# Patient Record
Sex: Male | Born: 1990
Health system: Southern US, Community
[De-identification: ages and names within clinical notes are randomized; demographics above are authoritative.]

## PROBLEM LIST (undated history)

## (undated) DIAGNOSIS — E559 Vitamin D deficiency, unspecified: Secondary | ICD-10-CM

## (undated) DIAGNOSIS — T7840XA Allergy, unspecified, initial encounter: Secondary | ICD-10-CM

## (undated) DIAGNOSIS — F840 Autistic disorder: Secondary | ICD-10-CM

## (undated) DIAGNOSIS — E618 Deficiency of other specified nutrient elements: Secondary | ICD-10-CM

## (undated) DIAGNOSIS — E71318 Other disorders of fatty-acid oxidation: Secondary | ICD-10-CM

## (undated) DIAGNOSIS — Z91018 Allergy to other foods: Secondary | ICD-10-CM

## (undated) DIAGNOSIS — J45909 Unspecified asthma, uncomplicated: Secondary | ICD-10-CM

## (undated) HISTORY — DX: Unspecified asthma, uncomplicated: J45.909

## (undated) HISTORY — DX: Allergy to other foods: Z91.018

## (undated) HISTORY — DX: Deficiency of other specified nutrient elements: E61.8

## (undated) HISTORY — DX: Autistic disorder: F84.0

## (undated) HISTORY — DX: Allergy, unspecified, initial encounter: T78.40XA

## (undated) HISTORY — DX: Vitamin D deficiency, unspecified: E55.9

## (undated) HISTORY — DX: Other disorders of fatty-acid oxidation: E71.318

---
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
## 2012-03-05 ENCOUNTER — Encounter: Payer: Self-pay | Admitting: *Deleted

## 2012-03-09 ENCOUNTER — Telehealth: Payer: Self-pay | Admitting: Family Medicine

## 2012-03-09 ENCOUNTER — Encounter: Payer: Self-pay | Admitting: Family Medicine

## 2012-03-09 NOTE — Telephone Encounter (Signed)
Ok to add a 2nd CPE to an afternoon that isn't already booked with med checks and CPE's.  Preferably a Wed or Thurs (not a Mon) on a day when I'm not the only provider in the office (any usual Wed or Thurs)

## 2012-06-18 ENCOUNTER — Encounter: Payer: Self-pay | Admitting: Family Medicine

## 2012-06-19 ENCOUNTER — Encounter: Payer: Self-pay | Admitting: Family Medicine

## 2012-07-09 ENCOUNTER — Ambulatory Visit (INDEPENDENT_AMBULATORY_CARE_PROVIDER_SITE_OTHER): Payer: BC Managed Care – PPO | Admitting: Family Medicine

## 2012-07-09 ENCOUNTER — Encounter: Payer: Self-pay | Admitting: Family Medicine

## 2012-07-09 VITALS — BP 100/68 | HR 84 | Ht 67.0 in | Wt 163.0 lb

## 2012-07-09 DIAGNOSIS — Z0000 Encounter for general adult medical examination without abnormal findings: Secondary | ICD-10-CM

## 2012-07-09 DIAGNOSIS — E559 Vitamin D deficiency, unspecified: Secondary | ICD-10-CM

## 2012-07-09 DIAGNOSIS — F840 Autistic disorder: Secondary | ICD-10-CM

## 2012-07-09 DIAGNOSIS — Z13220 Encounter for screening for lipoid disorders: Secondary | ICD-10-CM

## 2012-07-09 LAB — POCT URINALYSIS DIPSTICK
Bilirubin, UA: NEGATIVE
Blood, UA: NEGATIVE
Leukocytes, UA: NEGATIVE
Spec Grav, UA: 1.02
Urobilinogen, UA: NEGATIVE
pH, UA: 6

## 2012-07-09 NOTE — Progress Notes (Signed)
Chief Complaint  Patient presents with  . Annual Exam    nonfasting annual exam. Left foot needs to be looked at, something bothering him. Also had a "red line" on his penis from masturbating, would like you to check and make sure he hasn't injured himself.   Cristian Taylor is a 22 y.o. male who presents for a complete physical.  He has the following concerns:  Penile redness, as mentioned above. He is accompanied for part of the visit today by his mother.  She reports wanting only 1 vaccine at a time, thimerisol free, no preservatives, if possible.  Might go to college next year, possibly will live in dorm  Immunization History  Administered Date(s) Administered  . DTaP 10/19/1992, 12/18/1992, 03/07/1993, 11/28/1993, 06/23/1998  . Hepatitis B May 05, 1990, 09/18/1992, 06/08/1993  . HiB 10/19/1992, 12/18/1992, 03/07/1993, 11/28/1993  . IPV 10/19/1992, 12/18/1992, 03/07/1993, 06/23/1998  . MMR 11/28/1993, 06/23/1998  . Meningococcal Conjugate 07/17/2006  . Tdap 07/02/2004   Dentist: every 6 months Ophtho: within the year Exercise: rides bike, swims, pushups and sit-ups.  On swim team.  Past Medical History  Diagnosis Date  . Food allergy     and sensitivities  . Unspecified vitamin D deficiency     per Dr. Ananias Pilgrim  . Autistic disorder, current or active state   . Mineral deficiency, not elsewhere classified     per Dr. Ananias Pilgrim  . Disorders of fatty acid oxidation     per Dr. Ananias Pilgrim  . Asthma     childhood/resolved    History reviewed. No pertinent past surgical history.  History   Social History  . Marital Status: Single    Spouse Name: N/A    Number of Children: N/A  . Years of Education: N/A   Occupational History  . Not on file.   Social History Main Topics  . Smoking status: Never Smoker   . Smokeless tobacco: Not on file  . Alcohol Use: No  . Drug Use: No  . Sexually Active: Not on file   Other Topics Concern  . Not on file   Social History  Narrative   Lives with parents, brother and sister, 1 dog, 1 cat.  Being homeschooled since 8th grade.  He is taking a year off, and might take some classes at [employer] and then will apply to college.  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    Family History  Problem Relation Age of Onset  . Cancer Father     thyroid cancer  . Hypothyroidism Maternal Grandmother   . Cancer Maternal Grandmother     skin  . Heart disease Maternal Grandfather   . Diabetes Maternal Grandfather   . Asthma Paternal Grandmother   . Diabetes Paternal Grandfather     Current outpatient prescriptions:COD LIVER OIL PO, Take 4 capsules by mouth daily., Disp: , Rfl: ;  vitamin A 29562 UNIT capsule, Take 20,000 Units by mouth daily., Disp: , Rfl:  He actually takes a long list of supplements--see sheet brought in by mother.  Allergies  Allergen Reactions  . Casein   . Gluten Meal   . Mold Extract (Trichophyton)    ROS:  The patient denies anorexia, fever, weight changes, headaches,  vision loss, decreased hearing, ear pain, hoarseness, chest pain, palpitations, dizziness, syncope, dyspnea on exertion, cough, swelling, nausea, vomiting, diarrhea, constipation, abdominal pain, melena, hematochezia, indigestion/heartburn, hematuria, incontinence, erectile dysfunction, dysuria, joint pains, numbness, tingling, weakness, tremor, suspicious skin lesions, depression, anxiety, abnormal bleeding/bruising, or enlarged lymph nodes  PHYSICAL EXAM: BP 100/68  Pulse 84  Ht 5\' 7"  (1.702 m)  Wt 163 lb (73.936 kg)  BMI 25.52 kg/m2  General Appearance:    Alert, cooperative, no distress, appears stated age.  He has flat affect, poor eye contact. Flat speech with little inflection.  He answers questions, in full sentences, appropriately (just with little inflection, poor eye contact).  Head:    Normocephalic, without obvious abnormality, atraumatic  Eyes:    PERRL, conjunctiva/corneas clear, EOM's intact, fundi    benign  Ears:     Normal TM's and external ear canals  Nose:   Nares normal, mucosa normal, no drainage or sinus   tenderness  Throat:   Lips, mucosa, and tongue normal; teeth and gums normal  Neck:   Supple, no lymphadenopathy;  thyroid:  no   enlargement/tenderness/nodules; no carotid   bruit or JVD  Back:    Spine nontender, no curvature, ROM normal, no CVA     tenderness  Lungs:     Clear to auscultation bilaterally without wheezes, rales or     ronchi; respirations unlabored  Chest Wall:    No tenderness or deformity   Heart:    Regular rate and rhythm, S1 and S2 normal, no murmur, rub   or gallop  Breast Exam:    No chest wall tenderness, masses or gynecomastia  Abdomen:     Soft, non-tender, nondistended, normoactive bowel sounds,    no masses, no hepatosplenomegaly  Genitalia:    Normal male external genitalia without lesions. Some linear hyperpigmentation on posterior portion of shaft. No erythema or other abnormality.  Testicles without masses.  No inguinal hernias.  Rectal:   Deferred due to age <40 and lack of symptoms  Extremities:   No clubbing, cyanosis or edema  Pulses:   2+ and symmetric all extremities  Skin:   Skin color, texture, turgor normal, no rashes or lesions. Some mild acne on upper back and shoulder. Dry skin on upper arms, shoulder--patch of some irritation, recently excoriated  Lymph nodes:   Cervical, supraclavicular, and axillary nodes normal  Neurologic:   CNII-XII intact, normal strength, sensation and gait; reflexes 2+ and symmetric throughout          Psych:   Normal mood, hygiene and grooming.    ASSESSMENT/PLAN:  Routine general medical examination at a health care facility - Plan: Visual acuity screening, POCT Urinalysis Dipstick, Glucose, random, Lipid panel  Screening for lipoid disorders - Plan: Lipid panel  Unspecified vitamin D deficiency - Plan: Vitamin D 25 hydroxy  Autism spectrum disorder  Discussed PSA screening (risks/benefits), recommended at least  30 minutes of aerobic activity at least 5 days/week; proper sunscreen use reviewed; healthy diet, avoidance of alcohol, tobacco, drugs; abstinence/safe sex; regular seatbelt use. Self-testicular exam taught. Immunization recommendations discussed--see below.   Hep A and HPV are recommended, reviewed in detail.  Also discussed getting 2nd meningococcal vaccine at some point, prior to living in dorm. Yearly flu shots are encouraged.   Schedule nurse visits for Hep A and HPV (separately).  Also flu shot in fall.  Copies of packet inserts for vaccines were given to mother to review prior to giving.  Also to do labs at [facility]--fasting lipids, glucose and vitamin D

## 2012-07-09 NOTE — Patient Instructions (Addendum)

## 2012-07-10 ENCOUNTER — Encounter: Payer: Self-pay | Admitting: Family Medicine

## 2012-07-10 DIAGNOSIS — F840 Autistic disorder: Secondary | ICD-10-CM | POA: Insufficient documentation

## 2013-07-16 ENCOUNTER — Telehealth: Payer: Self-pay | Admitting: Family Medicine

## 2013-07-16 NOTE — Telephone Encounter (Signed)
I will look at forms on Monday.  They have only been seen once (last year for CPE) and I filled out form then (scanned).  I had attached current list of supplements to the form.  Did she drop off a new list to attach?  She never brought the boys back to have their labs done, as recommended at check-ups last year.  Schedule CPE, and bring them fasting so that we can do their bloodwork at their visits

## 2013-07-16 NOTE — Telephone Encounter (Signed)
Mother dropped off form that she says you fill out for them yearly regarding pt's supplements.  Please call when ready.  She will call back & schedule their CPE.  Form in your folder

## 2013-07-19 NOTE — Telephone Encounter (Signed)
I had sent this back to Laura--not sure if she saw/addressed.  They are not scheduled for visit.  They haven't been seen in over a year.  I don't plan on filling out the forms until they are at least scheduled for a visit (let her know that forms like this are usually filled out AT their visits, and require to be seen yearly).  As per previous message, come fasting so labs ordered last year can actually be done (never brought them back for labs)

## 2013-07-19 NOTE — Telephone Encounter (Signed)
Spoke with mom, Bonita QuinLinda and scheduled Will for 1st available CPE on 10/27/13, fasting 10:45am.

## 2013-07-23 NOTE — Telephone Encounter (Signed)
I prefer to fill forms out at visit, when she brings list of current supplements.  If she must have this done prior, then have her drop off list of supplements sooner.

## 2013-07-27 NOTE — Telephone Encounter (Signed)
Left message for mom to advise of Dr. Delford FieldKnapp's note

## 2013-10-18 ENCOUNTER — Telehealth: Payer: Self-pay | Admitting: Family Medicine

## 2013-10-18 NOTE — Telephone Encounter (Signed)
Given to Veronica to abstract, and to put with forms that will be filled out at his physical 

## 2013-10-18 NOTE — Telephone Encounter (Signed)
Mom dropped off letter with pt's supplements listed so you could review before pt's physical, letter in your folder

## 2013-10-19 ENCOUNTER — Other Ambulatory Visit: Payer: BC Managed Care – PPO

## 2013-10-19 DIAGNOSIS — Z0000 Encounter for general adult medical examination without abnormal findings: Secondary | ICD-10-CM

## 2013-10-19 DIAGNOSIS — E559 Vitamin D deficiency, unspecified: Secondary | ICD-10-CM

## 2013-10-19 DIAGNOSIS — Z13220 Encounter for screening for lipoid disorders: Secondary | ICD-10-CM

## 2013-10-20 ENCOUNTER — Encounter: Payer: Self-pay | Admitting: *Deleted

## 2013-10-20 LAB — LIPID PANEL
CHOLESTEROL: 138 mg/dL (ref 0–200)
HDL: 40 mg/dL (ref >39)
LDL CALC: 86 mg/dL (ref 0–99)
Total CHOL/HDL Ratio: 3.5 Ratio
Triglycerides: 62 mg/dL (ref <150)
VLDL: 12 mg/dL (ref 0–40)

## 2013-10-20 LAB — GLUCOSE, RANDOM: Glucose, Bld: 93 mg/dL (ref 70–99)

## 2013-10-20 LAB — VITAMIN D 25 HYDROXY (VIT D DEFICIENCY, FRACTURES): Vit D, 25-Hydroxy: 42 ng/mL (ref 30–89)

## 2013-10-27 ENCOUNTER — Ambulatory Visit (INDEPENDENT_AMBULATORY_CARE_PROVIDER_SITE_OTHER): Payer: BC Managed Care – PPO | Admitting: Family Medicine

## 2013-10-27 ENCOUNTER — Encounter: Payer: Self-pay | Admitting: Family Medicine

## 2013-10-27 VITALS — BP 118/70 | HR 76 | Ht 66.5 in | Wt 165.0 lb

## 2013-10-27 DIAGNOSIS — Z0000 Encounter for general adult medical examination without abnormal findings: Secondary | ICD-10-CM

## 2013-10-27 NOTE — Patient Instructions (Signed)
  HEALTH MAINTENANCE RECOMMENDATIONS:  It is recommended that you get at least 30 minutes of aerobic exercise at least 5 days/week (for weight loss, you may need as much as 60-90 minutes). This can be any activity that gets your heart rate up. This can be divided in 10-15 minute intervals if needed, but try and build up your endurance at least once a week.  Weight bearing exercise is also recommended twice weekly.  Eat a healthy diet with lots of vegetables, fruits and fiber.  "Colorful" foods have a lot of vitamins (ie green vegetables, tomatoes, red peppers, etc).  Limit sweet tea, regular sodas and alcoholic beverages, all of which has a lot of calories and sugar.  Up to 2 alcoholic drinks daily may be beneficial for men (unless trying to lose weight, watch sugars).  Drink a lot of water.  Sunscreen of at least SPF 30 should be used on all sun-exposed parts of the skin when outside between the hours of 10 am and 4 pm (not just when at beach or pool, but even with exercise, golf, tennis, and yard work!)  Use a sunscreen that says "broad spectrum" so it covers both UVA and UVB rays, and make sure to reapply every 1-2 hours.  Remember to change the batteries in your smoke detectors when changing your clock times in the spring and fall.  Use your seat belt every time you are in a car, and please drive safely and not be distracted with cell phones and texting while driving.  Yearly flu shots are recommended.  Meningitis vaccine (second dose) is recommended prior to college. Tetanus booster (Td is fine, doesn't have to be TdaP) is needed before June 2016.

## 2013-10-27 NOTE — Progress Notes (Signed)
Chief Complaint  Patient presents with  . nonfasting    nonfasting physical- had sausage and banana, right knee pain, stinging in chest and had a question about his testicles,unable to give  urine sample   Cristian Taylor is a 23 y.o. male who presents for a complete physical.  He has the following concerns:  Red mark on his penis he would like checked.  He thinks it might be related to when he used to masturbate. He wants to make sure that everything is okay for if/when he gets married and has sex, and wants children. He does not have a girlfriend, and has not been sexually active.  He is taking a class at [employer].  He works some odd jobs, and is out in the yard a lot. Applying to [employer] for next year, but would live at home.  Last year, he and his mother were given information on the recommended vaccines (HPV and hepatitis A), but they declined to have done.  Immunization History  Administered Date(s) Administered  . DTaP 10/19/1992, 12/18/1992, 03/07/1993, 11/28/1993, 06/23/1998  . Hepatitis B 03/27/92, 09/18/1992, 06/08/1993  . HiB (PRP-OMP) 10/19/1992, 12/18/1992, 03/07/1993, 11/28/1993  . IPV 10/19/1992, 12/18/1992, 03/07/1993, 06/23/1998  . MMR 11/28/1993, 06/23/1998  . Meningococcal Conjugate 07/17/2006  . Tdap 07/02/2004   Dentist: every 6 months  Ophtho: within 1-2 years Exercise: rides bike, swims (in summer), pushups and sit-ups and some weights.  Past Medical History  Diagnosis Date  . Food allergy     and sensitivities  . Unspecified vitamin D deficiency     per Dr. Deirdre Pippins  . Autistic disorder, current or active state   . Mineral deficiency, not elsewhere classified     per Dr. Deirdre Pippins  . Disorders of fatty acid oxidation     per Dr. Deirdre Pippins  . Asthma     childhood/resolved    History reviewed. No pertinent past surgical history.  History   Social History  . Marital Status: Single    Spouse Name: N/A    Number of Children: N/A  . Years of Education:  N/A   Occupational History  . Not on file.   Social History Main Topics  . Smoking status: Never Smoker   . Smokeless tobacco: Never Used  . Alcohol Use: No  . Drug Use: No  . Sexual Activity: No   Other Topics Concern  . Not on file   Social History Narrative   Lives with parents, brother and sister, 1 dog, 1 cat.  Being homeschooled since 8th grade. He is taking a spanish class at [employer].   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    Family History  Problem Relation Age of Onset  . Cancer Father     thyroid cancer  . Hypothyroidism Maternal Grandmother   . Cancer Maternal Grandmother     skin  . Heart disease Maternal Grandfather   . Diabetes Maternal Grandfather   . Asthma Paternal Grandmother   . Diabetes Paternal Grandfather     Outpatient Encounter Prescriptions as of 10/27/2013  Medication Sig  . Acetylcarnitine HCl 250 MG CAPS Take 1 capsule by mouth daily.  . Acetylcysteine (N-ACETYL-L-CYSTEINE PO) Take 500 mg by mouth daily.  . Betaine, Trimethylglycine, (TMG, TRIMETHYLGLYCINE, PO) Take 1-2 capsules by mouth daily. W/folinic acid & B12 (155m TMG)  . Cholecalciferol (VITAMIN D) 1000 UNITS capsule Take 1,000 Units by mouth daily.  . Coenzyme Q10 (COQ10) 50 MG CAPS Take 2 capsules by mouth daily.  . Digestive  Enzymes (ENZYME DIGEST PO) Take 3-4 capsules by mouth daily. Enzyme Complete  . FOLIC ACID PO Take by mouth. As needed for a portion of the year.  Marland Kitchen GAMMA AMINOBUTYRIC ACID PO Take 150 mg by mouth daily.  . Glucosamine Sulfate 500 MG CAPS Take 1 capsule by mouth daily.  Marland Kitchen GLUTATHIONE PO Take 250 mg by mouth daily.  Marland Kitchen GLYCINE PO Take by mouth. As needed for a portion of the year.  Marland Kitchen MAGNESIUM GLYCINATE PLUS PO Take 200 mg by mouth daily.   . Magnesium Oxide (MAG-OXIDE PO) Take 180 mg by mouth daily.  . Melatonin 1 MG CAPS Take 1 capsule by mouth as needed (for sleep).  . Multiple Vitamins-Minerals (ZINC PO) Take 20 mg by mouth daily.  . Omega-3 Fatty Acids  (FISH OIL) 1000 MG CAPS Take 4 capsules by mouth daily.  . Probiotic Product (SOLUBLE FIBER/PROBIOTICS PO) Take 1 tablet by mouth daily.  . TYROSINE PO Take by mouth. As needed for a portion of the year.  . vitamin A 10000 UNIT capsule Take 10,000 Units by mouth daily.  . vitamin C (ASCORBIC ACID) 250 MG tablet Take 250 mg by mouth daily.   . vitamin E 400 UNIT capsule Take 400 Units by mouth daily.  Marland Kitchen POTASSIUM CHLORIDE PO Take by mouth. As needed for a portion of the year.  . SELENIUM PO Take by mouth. As needed for a portion of the year.    Allergies  Allergen Reactions  . Casein   . Gluten Meal   . Mold Extract [Trichophyton]     ROS: The patient denies anorexia, fever, weight changes, headaches, vision loss, decreased hearing, ear pain, hoarseness, chest pain, palpitations, dizziness, syncope, dyspnea on exertion, cough, swelling, nausea, vomiting, diarrhea, constipation, abdominal pain, melena, hematochezia, indigestion/heartburn, hematuria, incontinence, erectile dysfunction, dysuria, joint pains, numbness, tingling, weakness, tremor, suspicious skin lesions, depression, anxiety, abnormal bleeding/bruising, or enlarged lymph nodes  Sometimes he feels some stinging in his chest, but he thinks it is the muscles. No pain or swelling in the testicles. Left knee clicks sometimes.  No pain or swelling.  He is very active.  PHYSICAL EXAM:  BP 118/70  Pulse 76  Ht 5' 6.5" (1.689 m)  Wt 165 lb (74.844 kg)  BMI 26.24 kg/m2  General Appearance:  Alert, cooperative, no distress, appears stated age. He has flat affect, intermittently poor eye contact. Flat speech with little inflection. He answers questions, in full sentences, appropriately    Head:  Normocephalic, without obvious abnormality, atraumatic   Eyes:  PERRL, conjunctiva/corneas clear, EOM's intact, fundi  benign   Ears:  Normal TM's and external ear canals   Nose:  Nares normal, mucosa normal, no drainage or sinus tenderness    Throat:  Lips, mucosa, and tongue normal; teeth and gums normal   Neck:  Supple, no lymphadenopathy; thyroid: no enlargement/tenderness/nodules; no carotid  bruit or JVD   Back:  Spine nontender, no curvature, ROM normal, no CVA tenderness   Lungs:  Clear to auscultation bilaterally without wheezes, rales or ronchi; respirations unlabored   Chest Wall:  No tenderness or deformity   Heart:  Regular rate and rhythm, S1 and S2 normal, no murmur, rub  or gallop   Breast Exam:  No chest wall tenderness, masses or gynecomastia   Abdomen:  Soft, non-tender, nondistended, normoactive bowel sounds,  no masses, no hepatosplenomegaly   Genitalia:  Normal male external genitalia without lesions. Some linear hyperpigmentation on posterior portion of shaft, centrally/midline, unchanged  from exam last year. No erythema or other abnormality. Testicles without masses. No inguinal hernias.   Rectal:  Deferred due to age <40 and lack of symptoms   Extremities:  No clubbing, cyanosis or edema   Pulses:  2+ and symmetric all extremities   Skin:  Skin color, texture, turgor normal, no rashes or lesions. Just above right medial clavicle--some slight hyperpigmentation/faint pink. No central clearing  Lymph nodes:  Cervical, supraclavicular, and axillary nodes normal   Neurologic:  CNII-XII intact, normal strength, sensation and gait; reflexes 2+ and symmetric throughout          Psych: Normal mood, hygiene and grooming.   ASSESSMENT/PLAN:  Annual physical exam  Recommended at least 30 minutes of aerobic activity at least 5 days/week, weight bearing exercise 2x/week; proper sunscreen use reviewed; healthy diet, avoidance of alcohol, tobacco, drugs; abstinence/safe sex; regular seatbelt use. Self-testicular exams.  Immunization recommendations discussed.   Hep A and HPV are recommended, as discussed last year (mother declines). Also discussed getting 2nd meningococcal vaccine at some point, prior to going to [employer]  (where he will be in much more crowded classes, even though living at home). She is interested in getting this, but not today. Only wants to get one vaccine at a time, and patient declined getting any vaccines today. It was encouraged that he get flu shot today--she reports they always decline flu shots. He will also be due for Td booster (had Tdap 2006, so really just needs Td).    Mother plans to schedule nurse visits for Td and Meningitis vaccine, prefering thimerisol-free vaccines. Declines influenza, HepA and HPV vaccines.  F/u 1 year, sooner prn. All questions answered, and forms filled out re: supplements.

## 2014-08-17 ENCOUNTER — Telehealth: Payer: Self-pay | Admitting: Family Medicine

## 2014-08-17 NOTE — Telephone Encounter (Signed)
Left message with person that answered the phone. Was informed that form was faxed to [employer] and original was ready for pick up.

## 2014-08-23 ENCOUNTER — Telehealth: Payer: Self-pay

## 2014-08-23 NOTE — Telephone Encounter (Signed)
Pt mother called wanting to know if her son needed a tetanus booster. Is it ok to schedule this?

## 2014-08-23 NOTE — Telephone Encounter (Signed)
Per note from 10/2013 physical: "Mother plans to schedule nurse visits for Td and Meningitis vaccine, prefering thimerisol-free vaccines."  So, okay to schedule Td, but Menveo is also recommended--please encourage her to schedule for both vaccines

## 2014-08-24 ENCOUNTER — Other Ambulatory Visit (INDEPENDENT_AMBULATORY_CARE_PROVIDER_SITE_OTHER): Payer: BLUE CROSS/BLUE SHIELD

## 2014-08-24 DIAGNOSIS — Z23 Encounter for immunization: Secondary | ICD-10-CM

## 2014-08-24 NOTE — Telephone Encounter (Signed)
Spoke with mom, Bonita Quin and she will think on the Menveo-but will come in today for the Td.

## 2014-12-07 ENCOUNTER — Encounter: Payer: Self-pay | Admitting: Family Medicine

## 2014-12-07 ENCOUNTER — Ambulatory Visit (INDEPENDENT_AMBULATORY_CARE_PROVIDER_SITE_OTHER): Payer: BLUE CROSS/BLUE SHIELD | Admitting: Family Medicine

## 2014-12-07 VITALS — BP 120/70 | HR 72 | Temp 98.3°F | Wt 156.6 lb

## 2014-12-07 DIAGNOSIS — R070 Pain in throat: Secondary | ICD-10-CM | POA: Diagnosis not present

## 2014-12-07 LAB — POCT RAPID STREP A (OFFICE): RAPID STREP A SCREEN: NEGATIVE

## 2014-12-07 NOTE — Patient Instructions (Signed)

## 2014-12-07 NOTE — Progress Notes (Signed)
   Subjective:    Patient ID: Cristian Taylor, male    DOB: 03/16/1990, 24 y.o.   MRN: [id number]  HPI Chief Complaint  Patient presents with  . sore throat    feels like something is in his throat. does not hurt to swallow   He is here with complaints of throat "feeling strange" since last night. Denies pain with swallowing, fever, chills, body aches, headache, sinus pressure, ear pain, nasal drainage or cough. Mother states patient has history of PANDAS from untreated strep as a child and she is very sensitive to him complaining of sore throat. Mother reports she has had a sore throat since last evening and states patient may be complaining of his throat hurting due to the "power of suggestion". He does not smoke.   Reviewed allergies, medications, past medical and social history.  Review of Systems Pertinent positives and negatives in the history of present illness.    Objective:   Physical Exam BP 120/70 mmHg  Pulse 72  Temp(Src) 98.3 F (36.8 C) (Oral)  Wt 156 lb 9.6 oz (71.033 kg) Alert and in no distress. No sinus tenderness Tympanic membranes and canals are normal. Pharyngeal area is normal. Neck is supple without adenopathy. Cardiac exam shows a regular sinus rhythm without murmurs or gallops. Lungs are clear to auscultation.     Assessment & Plan:  Throat discomfort - Plan: POCT rapid strep A, Strep A DNA probe  Suspect that his throat discomfort is related to possibly early virus. Rapid strep test negative, however, due to his medical history (PANDAS), his mother requests a confirmatory strep culture. Will follow up pending. Recommend symptomatic treatment such as saltwater gargles, throat lozenges and staying well hydrated.

## 2014-12-08 LAB — STREP A DNA PROBE: GASP: NOT DETECTED

## 2015-01-05 ENCOUNTER — Ambulatory Visit (INDEPENDENT_AMBULATORY_CARE_PROVIDER_SITE_OTHER): Payer: BLUE CROSS/BLUE SHIELD | Admitting: Family Medicine

## 2015-01-05 ENCOUNTER — Encounter: Payer: Self-pay | Admitting: Family Medicine

## 2015-01-05 VITALS — BP 118/80 | HR 76 | Temp 98.4°F | Resp 14 | Wt 156.8 lb

## 2015-01-05 DIAGNOSIS — R05 Cough: Secondary | ICD-10-CM | POA: Diagnosis not present

## 2015-01-05 DIAGNOSIS — R059 Cough, unspecified: Secondary | ICD-10-CM

## 2015-01-05 DIAGNOSIS — J069 Acute upper respiratory infection, unspecified: Secondary | ICD-10-CM

## 2015-01-05 NOTE — Patient Instructions (Signed)
  Drink plenty of water. Try and blow your nose rather than sniff. I believe that much of your cough is related to postnasal drainage, as I see nasal congestion. Your lungs are completely clear--no bronchitis or pneumonia. There does not appear to be any bacterial infection at this time. I recommend continuing the Mucinex D twice daily. If you still have a lot of sniffling and sneezing, then also take claritin or zyrtec daily along with it (be sure to get the plain kind, and not the D version). You may also continue to use a cough suppressant as needed, such as Delsym syrup.  The syrup should only contain dextromethorphan, otherwise it will likely have ingredients that are also in the other medications that are recommended..  Return if you develop fever, shortness of breath, persistent/worsening cough or other problems.

## 2015-01-05 NOTE — Progress Notes (Signed)
Chief Complaint  Patient presents with  . cough    had it a couple weeks, coughing up phlem, sore throat   Started 2 weeks ago with coughing and sneezing.  His mother had also been sick.  His sneezing got better, but he has residual cough.    Cough is mostly dry, but productive in the mornings.  Once he noticed some blood in the phlegm, otherwise is clear.  Denies any fever, denies shortness of breath.   He has been taking Mucinex-D (last taken 2 days ago), and also a cough medication (mother checked and no overlapping ingredients). These have helped some.  He presents today to try and find a way to "completely get rid of" the cough.  PMH, PSH, SH unchanged. Meds/supplements unchanged, OTC's as above. Allergies  Allergen Reactions  . Casein   . Gluten Meal   . Mold Extract [Trichophyton]     ROS:  No fever, chills, headache, dizziness, chest pain, shortness of breath, nausea, vomiting, diarrhea, rash or other complaints except as noted in HPI.  PHYSICAL EXAM: BP 118/80 mmHg  Pulse 76  Temp(Src) 98.4 F (36.9 C) (Oral)  Resp 14  Wt 156 lb 12.8 oz (71.124 kg)  Sniffling frequently during visit, no coughing. Well appearing male  HEENT: PERRL, EOMI, conjunctiva and sclera are clear.  TM's and EAC's are normal. Nasal mucosa is normal on the right, moderately edematous on the left. No erythema or purulence.  Sinuses are nontender. OP is clear Neck: no lymphadenopathy or mass Heart: regular rate and rhythm without murmur Lungs: clear bilaterally; no wheezes, rales, ronchi Skin: normal turgor, no rash Neuro: alert and oriented. Cranial nerves intact. Normal strength, gait  ASSESSMENT/PLAN:  Cough  Acute upper respiratory infection   Drink plenty of water. Try and blow your nose rather than sniff. I believe that much of your cough is related to postnasal drainage, as I see nasal congestion. Your lungs are completely clear--no bronchitis or pneumonia. There does not appear  to be any bacterial infection at this time. I recommend continuing the Mucinex D twice daily. If you still have a lot of sniffling and sneezing, then also take claritin or zyrtec daily along with it (be sure to get the plain kind, and not the D version). You may also continue to use a cough suppressant as needed, such as Delsym syrup.  The syrup should only contain dextromethorphan, otherwise it will likely have ingredients that are also in the other medications that are recommended..  Return if you develop fever, shortness of breath, persistent/worsening cough or other problems.

## 2015-02-20 ENCOUNTER — Telehealth: Payer: Self-pay | Admitting: Family Medicine

## 2015-02-20 NOTE — Telephone Encounter (Signed)
Pt mom dropped off forms to be filled out put in you folder would like to pick them up wed since mickell has a appt on wed,

## 2015-02-22 ENCOUNTER — Ambulatory Visit (INDEPENDENT_AMBULATORY_CARE_PROVIDER_SITE_OTHER): Payer: BLUE CROSS/BLUE SHIELD | Admitting: Family Medicine

## 2015-02-22 ENCOUNTER — Encounter: Payer: Self-pay | Admitting: Family Medicine

## 2015-02-22 ENCOUNTER — Encounter: Payer: BLUE CROSS/BLUE SHIELD | Admitting: Family Medicine

## 2015-02-22 VITALS — BP 108/72 | HR 84 | Ht 67.0 in | Wt 153.4 lb

## 2015-02-22 DIAGNOSIS — Z0000 Encounter for general adult medical examination without abnormal findings: Secondary | ICD-10-CM | POA: Diagnosis not present

## 2015-02-22 DIAGNOSIS — F840 Autistic disorder: Secondary | ICD-10-CM | POA: Diagnosis not present

## 2015-02-22 LAB — POCT URINALYSIS DIPSTICK
BILIRUBIN UA: NEGATIVE
Glucose, UA: NEGATIVE
KETONES UA: NEGATIVE
Leukocytes, UA: NEGATIVE
Nitrite, UA: NEGATIVE
PH UA: 8
PROTEIN UA: NEGATIVE
RBC UA: NEGATIVE
SPEC GRAV UA: 1.015
Urobilinogen, UA: 0.2

## 2015-02-22 NOTE — Telephone Encounter (Signed)
done

## 2015-02-22 NOTE — Progress Notes (Signed)
Chief Complaint  Patient presents with  . Annual Exam    nonfasting annual exam. Has a question about his testicles and would like you to examine them. Has been having left ankle and heel pain-saw doctor and as given exercises to do but they don not really work.    Cristian Taylor is a 25 y.o. male who presents for a complete physical.  He has the following concerns:  He has clicking of his knee with certain movements, feels "uneven".  Denies any swelling, only rarely hurts with activity. He also mentioned this last year at his physical, denies any worsening. He has some pain at his ankle, medial and lateral. Previously saw another doctor, "foot doctor", years ago.  Wanting it to be "healed", without needing to do daily exercises. He previously sprained the ankle, when he jumped off a swing. Mother reports he was treated with orthotics in the past. Denies swelling. He thinks it has gotten a little better with time.  No pain with exercise.  Denies pain in testicles, but is concerned that one feels higher than the other, wondering if normal.  No longer has any concerns about redness on the penis. He is not in a sexual relationship  Immunization History  Administered Date(s) Administered  . DTaP 10/19/1992, 12/18/1992, 03/07/1993, 11/28/1993, 06/23/1998  . Hepatitis B [birth year]92, 09/18/1992, 06/08/1993  . HiB (PRP-OMP) 10/19/1992, 12/18/1992, 03/07/1993, 11/28/1993  . IPV 10/19/1992, 12/18/1992, 03/07/1993, 06/23/1998  . MMR 11/28/1993, 06/23/1998  . Meningococcal Conjugate 07/17/2006  . Td 08/24/2014  . Tdap 07/02/2004   Dentist: every 6 months  Ophtho: within 2-3 years Exercise: Daily; rides bike, jogs, swims (in summer), pushups and sit-ups and some weights. Labs done 10/2013--normal vitamin D, fasting glucose Lab Results  Component Value Date   CHOL 138 10/19/2013   HDL 40 10/19/2013   LDLCALC 86 10/19/2013   TRIG 62 10/19/2013   CHOLHDL 3.5 10/19/2013   Past Medical History   Diagnosis Date  . Food allergy     and sensitivities  . Unspecified vitamin D deficiency     per Dr. Deirdre Pippins  . Autistic disorder, current or active state   . Mineral deficiency, not elsewhere classified     per Dr. Deirdre Pippins  . Disorders of fatty acid oxidation     per Dr. Deirdre Pippins  . Asthma     childhood/resolved    History reviewed. No pertinent past surgical history.  Social History   Social History  . Marital Status: Single    Spouse Name: N/A  . Number of Children: N/A  . Years of Education: N/A   Occupational History  . Not on file.   Social History Main Topics  . Smoking status: Never Smoker   . Smokeless tobacco: Never Used  . Alcohol Use: No  . Drug Use: No  . Sexual Activity: No   Other Topics Concern  . Not on file   Social History Narrative   Lives with parents, brother and sister, 1 dog, 1 cat.  He was homeschooled since 8th grade. He is taking biology and precalculus at [employer], hoping to at some point transfer to [employer].  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    Family History  Problem Relation Age of Onset  . Cancer Father     thyroid cancer  . Hypothyroidism Maternal Grandmother   . Cancer Maternal Grandmother     skin  . Heart disease Maternal Grandfather   . Diabetes Maternal Grandfather   . Asthma  Paternal Grandmother   . Diabetes Paternal Grandfather   . Hyperlipidemia Mother     Outpatient Encounter Prescriptions as of 02/22/2015  Medication Sig  . Acetylcarnitine HCl 250 MG CAPS Take 1 capsule by mouth daily.  . Acetylcysteine (N-ACETYL-L-CYSTEINE PO) Take 500 mg by mouth daily.  . Cholecalciferol (VITAMIN D) 1000 UNITS capsule Take 1,000 Units by mouth daily.  . Coenzyme Q10 (COQ10) 50 MG CAPS Take 2 capsules by mouth daily.  . Digestive Enzymes (ENZYME DIGEST PO) Take 3-4 capsules by mouth daily. Enzyme Complete  . FOLIC ACID PO Take by mouth. As needed for a portion of the year.  Marland Kitchen GAMMA AMINOBUTYRIC ACID PO  Take 150 mg by mouth daily. Reported on 01/05/2015  . Glucosamine Sulfate 500 MG CAPS Take 1 capsule by mouth daily. Reported on 01/05/2015  . GLUTATHIONE PO Take 250 mg by mouth daily. Reported on 01/05/2015  . GLYCINE PO Take by mouth. Reported on 01/05/2015  . MAGNESIUM GLYCINATE PLUS PO Take 200 mg by mouth daily. Reported on 01/05/2015  . Magnesium Oxide (MAG-OXIDE PO) Take 180 mg by mouth daily.  . Melatonin 1 MG CAPS Take 1 capsule by mouth as needed (for sleep).  . Multiple Vitamins-Minerals (ZINC PO) Take 20 mg by mouth daily. Reported on 01/05/2015  . Omega-3 Fatty Acids (FISH OIL) 1000 MG CAPS Take 4 capsules by mouth daily.  Marland Kitchen POTASSIUM CHLORIDE PO Take by mouth. As needed for a portion of the year.  . Probiotic Product (SOLUBLE FIBER/PROBIOTICS PO) Take 1 tablet by mouth daily.  . SELENIUM PO Take by mouth. As needed for a portion of the year.  . TYROSINE PO Take by mouth. Reported on 01/05/2015  . vitamin A 10000 UNIT capsule Take 10,000 Units by mouth daily.  . vitamin C (ASCORBIC ACID) 250 MG tablet Take 250 mg by mouth daily.   . vitamin E 400 UNIT capsule Take 400 Units by mouth daily.   No facility-administered encounter medications on file as of 02/22/2015.    Allergies  Allergen Reactions  . Casein   . Gluten Meal   . Mold Extract [Trichophyton]     ROS: The patient denies anorexia, fever, headaches, vision loss, decreased hearing, ear pain, hoarseness, chest pain, palpitations, dizziness, syncope, dyspnea on exertion, cough, swelling, nausea, vomiting, diarrhea, constipation, abdominal pain, melena, hematochezia, indigestion/heartburn, hematuria, incontinence, erectile dysfunction, dysuria, joint pains (just the clicking in knee and ankle as per HPI), numbness, tingling, weakness, tremor, suspicious skin lesions, depression, anxiety, abnormal bleeding/bruising, or enlarged lymph nodes  Sometimes he feels some stinging in his chest, but he thinks it is the muscles.  This is very short-lived. No pain or swelling in the testicles. +weight loss (over 10# in the last year)  PHYSICAL EXAM:  BP 108/72 mmHg  Pulse 84  Ht 5' 7" (1.702 m)  Wt 153 lb 6.4 oz (69.582 kg)  BMI 24.02 kg/m2  General Appearance:  Alert, cooperative, no distress, appears stated age. He has flat affect, intermittently poor eye contact. Flat speech with little inflection. He answers questions, in full sentences, appropriately. Some body odor noted in exam room  Head:  Normocephalic, without obvious abnormality, atraumatic   Eyes:  PERRL, conjunctiva/corneas clear, EOM's intact, fundi  benign   Ears:  Normal TM's and external ear canals   Nose:  Nares normal, mucosa normal, no drainage or sinus tenderness   Throat:  Lips, mucosa, and tongue normal; teeth and gums normal   Neck:  Supple, no lymphadenopathy; thyroid:  no enlargement/tenderness/nodules; no carotid  bruit or JVD   Back:  Spine nontender, no curvature, ROM normal, no CVA tenderness   Lungs:  Clear to auscultation bilaterally without wheezes, rales or ronchi; respirations unlabored   Chest Wall:  No tenderness or deformity   Heart:  Regular rate and rhythm, S1 and S2 normal, no murmur, rub  or gallop   Breast Exam:  No chest wall tenderness, masses or gynecomastia   Abdomen:  Soft, non-tender, nondistended, normoactive bowel sounds,  no masses, no hepatosplenomegaly   Genitalia:  Normal male external genitalia without lesions. Testicles without masses (left hangs lower than right). No inguinal hernias.   Rectal:  Deferred due to age <40 and lack of symptoms   Extremities:  No clubbing, cyanosis or edema. FROM of left knee and ankle, without effusion, warmth, crepitus, pain. Nontender.  Negative drawer test and ligaments intact  Pulses:  2+ and symmetric all extremities   Skin:  Skin color, texture, turgor normal, no rashes or lesions.   Lymph nodes:  Cervical, supraclavicular, and  axillary nodes normal   Neurologic:  CNII-XII intact, normal strength, sensation and gait; reflexes 2+ and symmetric throughout    Psych: Normal mood, hygiene and grooming       ASSESSMENT/PLAN:  Annual physical exam - Plan: POCT Urinalysis Dipstick, Visual acuity screening  Autism spectrum disorder   Recommended at least 30 minutes of aerobic activity at least 5 days/week, weight bearing exercise 2x/week; proper sunscreen use reviewed; healthy diet, avoidance of alcohol, tobacco, drugs; abstinence/safe sex; regular seatbelt use. Self-testicular exams. Immunization recommendations discussed.  Hep A and HPV are recommended, as well as 2nd Menveo and first Bexsero.  They only want to do one shot at a time, and none today, will return in May for nurse visit once school is finished. Recommended meningitis vaccines prior to going to [employer] (where he will be in much more crowded classes, even though living at home).  It was encouraged that he get flu shot today--she reports they always decline flu shots.  F/u 1 year, sooner prn. All questions answered, and forms filled out re: supplements.   Return for nurse visits for: 2nd dose of meninigitis (menveo) and first Bexsero HPV series also recommended (prior to age 39, when insurance no longer covers), and Hepatitis A series.  Declines flu shot  Prefers to have 1 shot at a time, and will call to schedule nurse visits.

## 2015-02-22 NOTE — Patient Instructions (Signed)
  HEALTH MAINTENANCE RECOMMENDATIONS:  It is recommended that you get at least 30 minutes of aerobic exercise at least 5 days/week (for weight loss, you may need as much as 60-90 minutes). This can be any activity that gets your heart rate up. This can be divided in 10-15 minute intervals if needed, but try and build up your endurance at least once a week.  Weight bearing exercise is also recommended twice weekly.  Eat a healthy diet with lots of vegetables, fruits and fiber.  "Colorful" foods have a lot of vitamins (ie green vegetables, tomatoes, red peppers, etc).  Limit sweet tea, regular sodas and alcoholic beverages, all of which has a lot of calories and sugar.  Up to 2 alcoholic drinks daily may be beneficial for men (unless trying to lose weight, watch sugars).  Drink a lot of water.  Sunscreen of at least SPF 30 should be used on all sun-exposed parts of the skin when outside between the hours of 10 am and 4 pm (not just when at beach or pool, but even with exercise, golf, tennis, and yard work!)  Use a sunscreen that says "broad spectrum" so it covers both UVA and UVB rays, and make sure to reapply every 1-2 hours.  Remember to change the batteries in your smoke detectors when changing your clock times in the spring and fall.  Use your seat belt every time you are in a car, and please drive safely and not be distracted with cell phones and texting while driving.   Please consider the following vaccines, as we discussed:  Gardisil (HPV)--this is a series of 3 shots, given once, then in a month and 6 months. Hepatitis A is 2 vaccines given 6 months apart.  Menactra/Menveo--He had this once in the past.  Needs a second dose. Meningitis B vaccine (Bexsero)--this is 2 vaccines, given a month part.  Please consider the thimerosal-free flu shot for next year (to be given in the Fall).

## 2015-09-22 ENCOUNTER — Telehealth: Payer: Self-pay

## 2015-09-22 NOTE — Telephone Encounter (Signed)
Chart reviewed.  We did labs in 10/2013 and everything was normal.  Routine labs aren't needed--unless required for insurance purposes (in which case, they need to specify what the form requires). If he is having any symptoms/problems, labs may be ordered at the time of his visit, but no routine labs are needed.

## 2015-09-22 NOTE — Telephone Encounter (Signed)
Pt has appt for physical on 04/25 and would like to have labs prior.  Pt can be reached on [phone].

## 2015-09-25 NOTE — Telephone Encounter (Signed)
Pt notified. /RLB  

## 2016-01-31 ENCOUNTER — Telehealth: Payer: Self-pay | Admitting: Family Medicine

## 2016-01-31 NOTE — Telephone Encounter (Signed)
Pt's mother came in and dropped off a form. She states these have been filled of before. Please call mother at [phone].

## 2016-05-01 ENCOUNTER — Encounter: Payer: BLUE CROSS/BLUE SHIELD | Admitting: Family Medicine

## 2016-05-07 NOTE — Progress Notes (Signed)
Chief Complaint  Patient presents with  . Annual Exam    nonfasting annual exam. Did not do eye exam or urine-he did not want to. Has had a cough that is lingering.  Has a type a "pop" in his left arm and elbow when he does push ups. Also wants to make sure he is not sterile-wants to make sure his testicles are ok.     Cristian Taylor is a 26 y.o. male who presents for a complete physical.  He has the following concerns:  He had a cold a few weeks ago, with a lingering cough.  He also has some allergies, sneezing.  OTC medication helps some.  Denies fevers.  Rarely produces phlegm, clearish-white.  Denies sinus pain, headache. Denies shortness of breath.  Feels popping in his left elbow when he does pushups. It isn't painful.  Denies any swelling.  This has been noticeable for quite a few months, not worsening.  He does self testicular exams, doesn't have masses or concerns. "I can't run out of sperm, can I?" Wanting to make sure there is nothing wrong that would prevent fertility.  He has never been in a sexual relationship.  Immunization History  Administered Date(s) Administered  . DTaP 10/19/1992, 12/18/1992, 03/07/1993, 11/28/1993, 06/23/1998  . Hepatitis B 09/07/1992, 09/18/1992, 06/08/1993  . HiB (PRP-OMP) 10/19/1992, 12/18/1992, 03/07/1993, 11/28/1993  . IPV 10/19/1992, 12/18/1992, 03/07/1993, 06/23/1998  . MMR 11/28/1993, 06/23/1998  . Meningococcal Conjugate 07/17/2006  . Td 08/24/2014  . Tdap 07/02/2004   Dentist: every 6 months  Ophtho: within 3-4 years; plans to schedule appointment Exercise: Daily; rides bike, walks/jogs daily.  Swims (in summer).  pushups and sit-ups and some weights. Labs done 10/2013--normal vitamin D, fasting glucose  Lab Results  Component Value Date   CHOL 138 10/19/2013   HDL 40 10/19/2013   LDLCALC 86 10/19/2013   TRIG 62 10/19/2013   CHOLHDL 3.5 10/19/2013    Past Medical History:  Diagnosis Date  . Asthma    childhood/resolved  .  Autistic disorder, current or active state   . Disorders of fatty acid oxidation    per Dr. Deirdre Pippins  . Food allergy    and sensitivities  . Mineral deficiency, not elsewhere classified    per Dr. Deirdre Pippins  . Unspecified vitamin D deficiency    per Dr. Deirdre Pippins    History reviewed. No pertinent surgical history.  Social History   Social History  . Marital status: Single    Spouse name: N/A  . Number of children: N/A  . Years of education: N/A   Occupational History  . Not on file.   Social History Main Topics  . Smoking status: Never Smoker  . Smokeless tobacco: Never Used  . Alcohol use No  . Drug use: No  . Sexual activity: No   Other Topics Concern  . Not on file   Social History Narrative   Lives with parents, brother and sister, 1 dog, 1 cat.  He was homeschooled since 8th grade. He is taking biology at [employer], hoping to at some point transfer to [employer].  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    Family History  Problem Relation Age of Onset  . Cancer Father     thyroid cancer  . Hyperlipidemia Mother   . Hypothyroidism Maternal Grandmother   . Cancer Maternal Grandmother     skin  . Heart disease Maternal Grandfather   . Diabetes Maternal Grandfather   . Asthma Paternal Grandmother   .  Diabetes Paternal Grandfather     Outpatient Encounter Prescriptions as of 05/08/2016  Medication Sig  . Acetylcarnitine HCl 250 MG CAPS Take 1 capsule by mouth daily.  . Acetylcysteine (N-ACETYL-L-CYSTEINE PO) Take 500 mg by mouth daily.  . Cholecalciferol (VITAMIN D) 1000 UNITS capsule Take 1,000 Units by mouth daily.  . Coenzyme Q10 (COQ10) 50 MG CAPS Take 2 capsules by mouth daily.  . Digestive Enzymes (ENZYME DIGEST PO) Take 3-4 capsules by mouth daily. Enzyme Complete  . FOLIC ACID PO Take by mouth. As needed for a portion of the year.  Marland Kitchen GAMMA AMINOBUTYRIC ACID PO Take 150 mg by mouth daily. Reported on 01/05/2015  . Glucosamine Sulfate 500 MG  CAPS Take 1 capsule by mouth daily. Reported on 01/05/2015  . GLUTATHIONE PO Take 250 mg by mouth daily. Reported on 01/05/2015  . GLYCINE PO Take by mouth. Reported on 01/05/2015  . MAGNESIUM GLYCINATE PLUS PO Take 200 mg by mouth daily. Reported on 01/05/2015  . Magnesium Oxide (MAG-OXIDE PO) Take 180 mg by mouth daily.  . Melatonin 1 MG CAPS Take 1 capsule by mouth as needed (for sleep).  . Multiple Vitamins-Minerals (ZINC PO) Take 20 mg by mouth daily. Reported on 01/05/2015  . Omega-3 Fatty Acids (FISH OIL) 1000 MG CAPS Take 4 capsules by mouth daily.  Marland Kitchen POTASSIUM CHLORIDE PO Take by mouth. As needed for a portion of the year.  . Probiotic Product (SOLUBLE FIBER/PROBIOTICS PO) Take 1 tablet by mouth daily.  . SELENIUM PO Take by mouth. As needed for a portion of the year.  . TYROSINE PO Take by mouth. Reported on 01/05/2015  . vitamin A 10000 UNIT capsule Take 10,000 Units by mouth daily.  . vitamin C (ASCORBIC ACID) 250 MG tablet Take 250 mg by mouth daily.   . vitamin E 400 UNIT capsule Take 400 Units by mouth daily.   No facility-administered encounter medications on file as of 05/08/2016.    Also uses an OTC anithistamine as needed for allergies (isn't sure which one currently, varies between them, whatever is available).  Allergies  Allergen Reactions  . Casein   . Gluten Meal   . Mold Extract [Trichophyton]     ROS: The patient denies anorexia, fever, headaches, vision loss, decreased hearing, ear pain, hoarseness, chest pain, palpitations, dizziness, syncope, dyspnea on exertion, swelling, nausea, vomiting, diarrhea, constipation, abdominal pain, melena, hematochezia, indigestion/heartburn, hematuria, incontinence, erectile dysfunction, dysuria, joint pains (just the clicking in left elbow as per HPI), numbness, tingling, weakness, tremor, suspicious skin lesions, depression, anxiety, abnormal bleeding/bruising, or enlarged lymph nodes  No pain or swelling in the  testicles.    PHYSICAL EXAM:  BP 112/68 (BP Location: Left Arm, Patient Position: Sitting, Cuff Size: Normal)   Pulse 64   Temp 98 F (36.7 C) (Tympanic)   Ht '5\' 7"'$  (1.702 m)   Wt 158 lb 9.6 oz (71.9 kg)   BMI 24.84 kg/m   Wt Readings from Last 3 Encounters:  02/22/15 153 lb 6.4 oz (69.6 kg)  01/05/15 156 lb 12.8 oz (71.1 kg)  12/07/14 156 lb 9.6 oz (71 kg)    General Appearance:  Alert, cooperative, no distress, appears stated age. He has flat affect, poor eye contact. Flat speech with little inflection, somewhat staccato. He answers questions, in short sentences, appropriately.   Head:  Normocephalic, without obvious abnormality, atraumatic   Eyes:  PERRL, conjunctiva/corneas clear, EOM's intact, fundi benign   Ears:  Normal TM's and external ear canals  Nose:  Nares normal, mucosa is mildly edematous with some clear mucus noted; no sinus tenderness   Throat:  Lips, mucosa, and tongue normal; teeth and gums normal   Neck:  Supple, no lymphadenopathy; thyroid: no enlargement/tenderness/nodules; no carotid bruit or JVD   Back:  Spine nontender, no curvature, ROM normal, no CVA tenderness   Lungs:  Clear to auscultation bilaterally without wheezes, rales or ronchi; respirations unlabored   Chest Wall:  No tenderness or deformity   Heart:  Regular rate and rhythm, S1 and S2 normal, no murmur, rub  or gallop   Breast Exam:  No chest wall tenderness, masses or gynecomastia   Abdomen:  Soft, non-tender, nondistended, normoactive bowel sounds,  no masses, no hepatosplenomegaly   Genitalia:  Normal male external genitalia without lesions. Testicles without masses (left hangs lower than right). No inguinal hernias.   Rectal:  Deferred due to age <40 and lack of symptoms   Extremities:  No clubbing, cyanosis or edema. FROM at elbow without crepitus or popping.  No pain with flexion/extension/pronation/supination against resistance; nontender to palpation.   Pulses:  2+ and symmetric all extremities   Skin:  Skin color, texture, turgor normal, no rashes or lesions.   Lymph nodes:  Cervical, supraclavicular, and axillary nodes normal   Neurologic:  CNII-XII intact, normal strength, sensation and gait; reflexes 2+ and symmetric throughout     Psych:  Normal mood, hygiene and grooming   ASSESSMENT/PLAN:  Annual physical exam  Autism spectrum disorder  Need for meningococcal vaccination - given 2nd dose of meningitis conjugate. Bexsero course also recommended--to make NV for injectioins if/when interested - Plan: MENINGOCOCCAL MCV4O(MENVEO)  Cough - since recent URI; suspect component of allergies and PND--antihistamines and mucinex prn. Reassured no e/o infection  Counseling for HPV (human papillomavirus) vaccination - vaccine risks/benefits reviewed, and encouraged him to get (prior to sexual activity, and while covered by insurance, <12 yo)  Elbow clicking - normal exam--reassured.     Recommended at least 30 minutes of aerobic activity at least 5 days/week, weight bearing exercise 2x/week; proper sunscreen use reviewed; healthy diet, avoidance of alcohol, tobacco, drugs; abstinence/safe sex; regular seatbelt use. Self-testicular exams. Immunization recommendations discussed.  Last year Hep A and HPV are recommended, as well as 2nd Menveo and first Bexsero.  They only wanted to do one shot at a time, and none at the time of his visit  Last year they said they would return in May for nurse visit once school is finished, but they never did. Recommended meningitis vaccines prior to going to [employer] (where he will be in much more crowded classes, even though living at home).  He still hasn't started there--thinking of taking a summer class there to test it out.   F/u 1 year, sooner prn, other than NV for recommended vaccines.    Recommended: 2nd dose of meninigitis (menveo)--given today Bexsero series of  2 HPV series also recommended (prior to age 13, when insurance no longer covers), and Hepatitis A series.

## 2016-05-08 ENCOUNTER — Ambulatory Visit (INDEPENDENT_AMBULATORY_CARE_PROVIDER_SITE_OTHER): Payer: BLUE CROSS/BLUE SHIELD | Admitting: Family Medicine

## 2016-05-08 ENCOUNTER — Encounter: Payer: Self-pay | Admitting: Family Medicine

## 2016-05-08 VITALS — BP 112/68 | HR 64 | Temp 98.0°F | Ht 67.0 in | Wt 158.6 lb

## 2016-05-08 DIAGNOSIS — Z23 Encounter for immunization: Secondary | ICD-10-CM | POA: Diagnosis not present

## 2016-05-08 DIAGNOSIS — R059 Cough, unspecified: Secondary | ICD-10-CM

## 2016-05-08 DIAGNOSIS — R29898 Other symptoms and signs involving the musculoskeletal system: Secondary | ICD-10-CM

## 2016-05-08 DIAGNOSIS — F840 Autistic disorder: Secondary | ICD-10-CM

## 2016-05-08 DIAGNOSIS — Z0000 Encounter for general adult medical examination without abnormal findings: Secondary | ICD-10-CM | POA: Diagnosis not present

## 2016-05-08 DIAGNOSIS — R05 Cough: Secondary | ICD-10-CM | POA: Diagnosis not present

## 2016-05-08 DIAGNOSIS — Z7189 Other specified counseling: Secondary | ICD-10-CM

## 2016-05-08 DIAGNOSIS — Z7185 Encounter for immunization safety counseling: Secondary | ICD-10-CM

## 2016-05-08 NOTE — Patient Instructions (Addendum)
  HEALTH MAINTENANCE RECOMMENDATIONS:  It is recommended that you get at least 30 minutes of aerobic exercise at least 5 days/week (for weight loss, you may need as much as 60-90 minutes). This can be any activity that gets your heart rate up. This can be divided in 10-15 minute intervals if needed, but try and build up your endurance at least once a week.  Weight bearing exercise is also recommended twice weekly.  Eat a healthy diet with lots of vegetables, fruits and fiber.  "Colorful" foods have a lot of vitamins (ie green vegetables, tomatoes, red peppers, etc).  Limit sweet tea, regular sodas and alcoholic beverages, all of which has a lot of calories and sugar. Drink a lot of water.  Sunscreen of at least SPF 30 should be used on all sun-exposed parts of the skin when outside between the hours of 10 am and 4 pm (not just when at beach or pool, but even with exercise, golf, tennis, and yard work!)  Use a sunscreen that says "broad spectrum" so it covers both UVA and UVB rays, and make sure to reapply every 1-2 hours.  Remember to change the batteries in your smoke detectors when changing your clock times in the spring and fall.  Use your seat belt every time you are in a car, and please drive safely and not be distracted with cell phones and texting while driving.  Your lungs are clear--I suspect your cough is related to postnasal drainage--either residual from your illness, or more likely from your allergies. Continue to take an antihistamine daily during this allergy season (such as claritin, zyrtec). Consider using mucinex (guaifenesin) as needed for chest congestion/cough.   Recommended vaccines include HPV (Gardisil), the second meningitis (Menveo/Menactra--he had first at age 42), and the Meningitis B series (Bexsero). Hepatitis A is recommended, but lowest priority amongst these--can get at some point.

## 2016-06-07 ENCOUNTER — Ambulatory Visit (INDEPENDENT_AMBULATORY_CARE_PROVIDER_SITE_OTHER): Payer: BLUE CROSS/BLUE SHIELD | Admitting: Family Medicine

## 2016-06-07 ENCOUNTER — Ambulatory Visit: Payer: BLUE CROSS/BLUE SHIELD | Admitting: Family Medicine

## 2016-06-07 VITALS — BP 110/60 | HR 63 | Temp 97.4°F | Wt 158.0 lb

## 2016-06-07 DIAGNOSIS — H6692 Otitis media, unspecified, left ear: Secondary | ICD-10-CM

## 2016-06-07 MED ORDER — AMOXICILLIN 875 MG PO TABS: 875.0000 mg | ORAL_TABLET | Freq: Two times a day (BID) | ORAL | 0 refills | Status: DC

## 2016-06-07 NOTE — Progress Notes (Signed)
   Subjective:    Patient ID: Cristian Taylor, male    DOB: 04/11/1990, 26 y.o.   MRN: [id number]  HPI He is here for evaluation of a 6 week history of difficulty with cough and congestion that initially also included sore throat and sneezing. The cough has continued. He does not complain of fever, chills, earache. He does not smoke. He does have seasonal allergies.   Review of Systems     Objective:   Physical Exam Alert and in no distress. Tympanic membrane on the left is red and slightly dull, right is normal, canals are normal. Pharyngeal area is normal. Neck is supple without adenopathy or thyromegaly. Cardiac exam shows a regular sinus rhythm without murmurs or gallops. Lungs are clear to auscultation.        Assessment & Plan:  Left otitis media, unspecified otitis media type - Plan: amoxicillin (AMOXIL) 875 MG tablet He will call if further difficulties.

## 2016-06-24 ENCOUNTER — Encounter: Payer: Self-pay | Admitting: Family Medicine

## 2016-06-24 ENCOUNTER — Ambulatory Visit (INDEPENDENT_AMBULATORY_CARE_PROVIDER_SITE_OTHER): Payer: BLUE CROSS/BLUE SHIELD | Admitting: Family Medicine

## 2016-06-24 VITALS — BP 116/70 | HR 84 | Wt 155.0 lb

## 2016-06-24 DIAGNOSIS — R059 Cough, unspecified: Secondary | ICD-10-CM

## 2016-06-24 DIAGNOSIS — R05 Cough: Secondary | ICD-10-CM | POA: Diagnosis not present

## 2016-06-24 DIAGNOSIS — H6691 Otitis media, unspecified, right ear: Secondary | ICD-10-CM | POA: Diagnosis not present

## 2016-06-24 MED ORDER — AMOXICILLIN-POT CLAVULANATE 875-125 MG PO TABS: 1.0000 | ORAL_TABLET | Freq: Two times a day (BID) | ORAL | 0 refills | Status: DC

## 2016-06-24 NOTE — Progress Notes (Signed)
   Subjective:    Patient ID: Cristian Taylor, male    DOB: 01/27/1990, 26 y.o.   MRN: [id number]  HPI He is here for a recheck. He is still having some slight discomfort in his right ear as well as a cough but no congestion, fever, chills, sore throat.   Review of Systems     Objective:   Physical Exam Alert and in no distress. Tympanic membrane on the left is normal, right is slightly dull and vascular canals are normal. Pharyngeal area is normal. Neck is supple without adenopathy or thyromegaly. Cardiac exam shows a regular sinus rhythm without murmurs or gallops. Lungs are clear to auscultation.        Assessment & Plan:  Right otitis media, unspecified otitis media type - Plan: amoxicillin-clavulanate (AUGMENTIN) 875-125 MG tablet  Cough - Plan: amoxicillin-clavulanate (AUGMENTIN) 875-125 MG tablet

## 2016-07-18 ENCOUNTER — Ambulatory Visit (INDEPENDENT_AMBULATORY_CARE_PROVIDER_SITE_OTHER): Payer: BLUE CROSS/BLUE SHIELD | Admitting: Family Medicine

## 2016-07-18 VITALS — BP 120/80 | HR 70 | Temp 98.0°F | Wt 158.0 lb

## 2016-07-18 DIAGNOSIS — H6692 Otitis media, unspecified, left ear: Secondary | ICD-10-CM | POA: Diagnosis not present

## 2016-07-18 MED ORDER — AMOXICILLIN-POT CLAVULANATE 875-125 MG PO TABS: 1.0000 | ORAL_TABLET | Freq: Two times a day (BID) | ORAL | 0 refills | Status: DC

## 2016-07-18 NOTE — Progress Notes (Signed)
   Subjective:    Patient ID: Cristian Taylor, male    DOB: 05/27/1990, 26 y.o.   MRN: [id number]  HPI He is here for evaluation of continued difficulty with a slight cough. He does not complain of fever, chills, earache, sore throat.   Review of Systems     Objective:   Physical Exam Alert and in no distress. Tympanic membrane on the left is slightly erythematous and dull, right is normal, canals are normal. Pharyngeal area is normal. Neck is supple without adenopathy or thyromegaly. Cardiac exam shows a regular sinus rhythm without murmurs or gallops. Lungs are clear to auscultation.        Assessment & Plan:  Left otitis media, unspecified otitis media type - Plan: amoxicillin-clavulanate (AUGMENTIN) 875-125 MG tablet He was given Amoxil before and I will therefore switch him to Augmentin and see if this will help with his cough. I think the cough is actually coming from the otitis.

## 2016-09-19 DIAGNOSIS — H5203 Hypermetropia, bilateral: Secondary | ICD-10-CM | POA: Diagnosis not present

## 2016-12-26 ENCOUNTER — Encounter: Payer: BLUE CROSS/BLUE SHIELD | Admitting: Family Medicine

## 2017-02-07 ENCOUNTER — Telehealth: Payer: Self-pay | Admitting: Family Medicine

## 2017-02-07 NOTE — Telephone Encounter (Signed)
Pt mom dropped off form to be filled out put in your folder pt mom can be reached at [phone] when ready to be picked up informed pt mom you was out of the office today

## 2017-02-12 NOTE — Telephone Encounter (Signed)
FFO. Please attach list of supplements and advise mom when ready

## 2017-02-12 NOTE — Telephone Encounter (Signed)
Patient's mom advised 

## 2017-04-04 DIAGNOSIS — D225 Melanocytic nevi of trunk: Secondary | ICD-10-CM | POA: Diagnosis not present

## 2017-04-04 DIAGNOSIS — B070 Plantar wart: Secondary | ICD-10-CM | POA: Diagnosis not present

## 2017-04-04 DIAGNOSIS — D2262 Melanocytic nevi of left upper limb, including shoulder: Secondary | ICD-10-CM | POA: Diagnosis not present

## 2017-04-04 DIAGNOSIS — D2261 Melanocytic nevi of right upper limb, including shoulder: Secondary | ICD-10-CM | POA: Diagnosis not present

## 2017-10-30 ENCOUNTER — Telehealth: Payer: Self-pay

## 2017-10-30 NOTE — Telephone Encounter (Signed)
He hasn't had a physical since 04/2016, last seen in office over a year ago (07/2016) for acute visit. I last filled out form for him 02/2017. I thought they were only needed yearly, in which case, CPE just needs to be Jan/Feb.  He can be 2nd CPE in the afternoon on Wed 1/15

## 2017-10-30 NOTE — Telephone Encounter (Signed)
Pt mother called and wants to know if patient will need his paperwork filled out. He does not currently have an appointment scheduled. Will he need to have a physical to have paperwork filled out? If so are you willing to add a physical to one of your days?   Patient mom can be reached at [phone].

## 2017-11-04 NOTE — Telephone Encounter (Signed)
appt has been scheduled for 01/21/18

## 2017-12-26 DIAGNOSIS — F840 Autistic disorder: Secondary | ICD-10-CM | POA: Diagnosis not present

## 2018-01-21 ENCOUNTER — Encounter: Payer: BLUE CROSS/BLUE SHIELD | Admitting: Family Medicine

## 2018-02-17 NOTE — Patient Instructions (Addendum)
  HEALTH MAINTENANCE RECOMMENDATIONS:  It is recommended that you get at least 30 minutes of aerobic exercise at least 5 days/week (for weight loss, you may need as much as 60-90 minutes). This can be any activity that gets your heart rate up. This can be divided in 10-15 minute intervals if needed, but try and build up your endurance at least once a week.  Weight bearing exercise is also recommended twice weekly.  Eat a healthy diet with lots of vegetables, fruits and fiber.  "Colorful" foods have a lot of vitamins (ie green vegetables, tomatoes, red peppers, etc).  Limit sweet tea, regular sodas and alcoholic beverages, all of which has a lot of calories and sugar. Drink a lot of water.  Sunscreen of at least SPF 30 should be used on all sun-exposed parts of the skin when outside between the hours of 10 am and 4 pm (not just when at beach or pool, but even with exercise, golf, tennis, and yard work!)  Use a sunscreen that says "broad spectrum" so it covers both UVA and UVB rays, and make sure to reapply every 1-2 hours.  Remember to change the batteries in your smoke detectors when changing your clock times in the spring and fall.  Use your seat belt every time you are in a car, and please drive safely and not be distracted with cell phones and texting while driving.  Return for 2nd and 3rd Gardisil (HPV vaccines) in 2 and 6 months.  Return for a physical in a year, sooner if you have any questions or concerns.

## 2018-02-17 NOTE — Progress Notes (Signed)
Chief Complaint  Patient presents with  . Annual Exam    nonfasting annual exam. Prefers to see eye doctor and did not want me to check his urine today. Says that he feels a pain in his chest area from time to time. He would also like to know if masterbating can make you sterile. Sometimes when he goes to music concerts the beat is louder in right ear than left-only at concerts, not while we are talking.     Cristian Taylor is a 28 y.o. male who presents for a complete physical.  He has the following concerns:  L knee pain sometimes, "for as long as I can remember", started after jumping off of something.  No pain with biking or activity.  Notices it is sometimes in the evening at rest, doesn't keep him awake or bother him too much.  Never needed any treatment. Denies any swelling. Heel pain (left) started at the same time (jump).   Off/on pain, not related to activity or shoes.   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 Per discussion with mother, he is in class more, so has been exercising less, but still eating/snacking the same.  Some weight gain noted. Pt denies his clothing getting tighter. Drinks some sodas.  Drinks almond milk or soy milk.  Like fruits/vegetables.  Discussed HPV vaccines with his mother on his way here, and would like to start those.  Immunization History  Administered Date(s) Administered  . DTaP 10/19/1992, 12/18/1992, 03/07/1993, 11/28/1993, 06/23/1998  . Hepatitis B 03-11-92, 09/18/1992, 06/08/1993  . HiB (PRP-OMP) 10/19/1992, 12/18/1992, 03/07/1993, 11/28/1993  . IPV 10/19/1992, 12/18/1992, 03/07/1993, 06/23/1998  . MMR 11/28/1993, 06/23/1998  . Meningococcal Conjugate 07/17/2006  . Meningococcal Mcv4o 05/08/2016  . Td 08/24/2014  . Tdap 07/02/2004   Got flu shot Dentist: every 6 months  Ophtho: "we schedule frequent appointments", denies problems. Exercise: Daily; rides bike or walks 15 minutes/day, every day.  Swims (in summer).  pushups and sit-ups and some  weights occasionally. Labs done 10/2013--normal vitamin D, fasting glucose Lab Results  Component Value Date   CHOL 138 10/19/2013   HDL 40 10/19/2013   LDLCALC 86 10/19/2013   TRIG 62 10/19/2013   CHOLHDL 3.5 10/19/2013    ROS: The patient denies anorexia, fever, headaches, vision loss, decreased hearing, ear pain, hoarseness, chest pain, palpitations, dizziness, syncope, dyspnea on exertion, swelling, nausea, vomiting, diarrhea, constipation, abdominal pain, melena, hematochezia, indigestion/heartburn, hematuria, incontinence, erectile dysfunction, dysuria, numbness, tingling, weakness, tremor, suspicious skin lesions, abnormal bleeding/bruising, or enlarged lymph nodes  No pain or swelling in the testicles. Occasional pain in mid chest when he is very nervous.  Sometimes has mucle pain at his L chest. Doesn't last long. Left knee and heel pain occasionally, never sustained, not very bothersome, going o for quite a while, unchanged (see HPI). Occasionally anxious or slightly depressed, but able to get out of it himself. Denies SI   PHYSICAL EXAM:  BP 110/80   Pulse 80   Ht '5\' 7"'$  (1.702 m)   Wt 178 lb 12.8 oz (81.1 kg)   BMI 28.00 kg/m   Wt Readings from Last 3 Encounters:  02/19/18 178 lb 12.8 oz (81.1 kg)  07/18/16 158 lb (71.7 kg)  06/24/16 155 lb (70.3 kg)    General Appearance:  Alert, cooperative, no distress, appears stated age. He has flat affect, intermittently poor eye contact. Flat speech with little inflection, somewhat staccato. He answers questions, in short sentences, appropriately.   Head:  Normocephalic, without obvious abnormality,  atraumatic   Eyes:  PERRL, conjunctiva/corneas clear, EOM's intact, fundi benign   Ears:  Normal TM's and external ear canals   Nose:  Nares normal, mucosa is normal, no sinus tenderness   Throat:  Lips, mucosa, and tongue normal; teeth and gums normal   Neck:  Supple, no lymphadenopathy; thyroid: no enlargement/  enderness/nodules; no carotid bruit or JVD   Back:  Spine nontender, no curvature, ROM normal, no CVA tenderness   Lungs:  Clear to auscultation bilaterally without wheezes, rales or ronchi; respirations unlabored   Chest Wall:  No tenderness or deformity   Heart:  Regular rate and rhythm, S1 and S2 normal, no murmur, rub  or gallop   Breast Exam:  No chest wall tenderness, masses or gynecomastia   Abdomen:  Soft, non-tender, nondistended, normoactive bowel sounds,  no masses, no hepatosplenomegaly   Genitalia:  Normal male external genitalia without lesions. Testicles without masses. No inguinal hernias.   Rectal:  Deferred due to age <40 and lack of symptoms   Extremities:  No clubbing, cyanosis or edema.  Left knee with FROM, entirely normal exam.  Left heel is nontender   Pulses:  2+ and symmetric all extremities   Skin:  Skin color, texture, turgor normal, no rashes or lesions. Plantar wart noted on the left heel (pt states has been getting treatment for this), nontender  Lymph nodes:  Cervical, supraclavicular, and axillary nodes normal   Neurologic:  CNII-XII intact, normal strength, sensation and gait; reflexes 2+ and symmetric throughout     Psych:  Normal mood, hygiene and grooming   ASSESSMENT/PLAN:  Weight gain discussed with patient (and mother, separately); encouraged increased water intake, cut back on sodas and snacks; increase exercise to at least 150 mins/week.   Recommended at least 30 minutes of aerobic activity at least 5 days/week, weight bearing exercise 2x/week; proper sunscreen use reviewed; healthy diet, avoidance of alcohol, tobacco, drugs; abstinence/safe sex; regular seatbelt use. Self-testicular exams. Immunization recommendations discussed. He does only one vaccine at a time, last year got his second menveo.  HPV had been recommended, as well as Bexsero.  He also has never had Hepatitis A series. Counseled re  risks benefits of Gardisil, #1 given today.    HPV#1 today Schedule NV for 2 and 6 months from now for the rest of the series.   F/u 1 year for CPE

## 2018-02-19 ENCOUNTER — Ambulatory Visit (INDEPENDENT_AMBULATORY_CARE_PROVIDER_SITE_OTHER): Payer: BLUE CROSS/BLUE SHIELD | Admitting: Family Medicine

## 2018-02-19 ENCOUNTER — Encounter: Payer: Self-pay | Admitting: Family Medicine

## 2018-02-19 VITALS — BP 110/80 | HR 80 | Ht 67.0 in | Wt 178.8 lb

## 2018-02-19 DIAGNOSIS — Z23 Encounter for immunization: Secondary | ICD-10-CM

## 2018-02-19 DIAGNOSIS — F840 Autistic disorder: Secondary | ICD-10-CM

## 2018-02-19 DIAGNOSIS — B070 Plantar wart: Secondary | ICD-10-CM | POA: Diagnosis not present

## 2018-02-19 DIAGNOSIS — Z0000 Encounter for general adult medical examination without abnormal findings: Secondary | ICD-10-CM

## 2018-04-23 ENCOUNTER — Other Ambulatory Visit (INDEPENDENT_AMBULATORY_CARE_PROVIDER_SITE_OTHER): Payer: BLUE CROSS/BLUE SHIELD

## 2018-04-23 ENCOUNTER — Other Ambulatory Visit: Payer: Self-pay

## 2018-04-23 DIAGNOSIS — Z23 Encounter for immunization: Secondary | ICD-10-CM | POA: Diagnosis not present

## 2018-08-24 ENCOUNTER — Other Ambulatory Visit: Payer: Self-pay

## 2018-08-24 ENCOUNTER — Other Ambulatory Visit (INDEPENDENT_AMBULATORY_CARE_PROVIDER_SITE_OTHER): Payer: BC Managed Care – PPO

## 2018-08-24 DIAGNOSIS — Z23 Encounter for immunization: Secondary | ICD-10-CM

## 2019-04-14 ENCOUNTER — Encounter: Payer: Self-pay | Admitting: *Deleted

## 2019-12-31 ENCOUNTER — Ambulatory Visit (HOSPITAL_COMMUNITY)
Admission: EM | Admit: 2019-12-31 | Discharge: 2019-12-31 | Disposition: A | Discharge status: Home / Self Care | Payer: Self-pay | Attending: Family Medicine | Admitting: Family Medicine

## 2019-12-31 ENCOUNTER — Encounter (HOSPITAL_COMMUNITY): Payer: Self-pay

## 2019-12-31 ENCOUNTER — Other Ambulatory Visit: Payer: Self-pay

## 2019-12-31 DIAGNOSIS — S0502XA Injury of conjunctiva and corneal abrasion without foreign body, left eye, initial encounter: Secondary | ICD-10-CM

## 2019-12-31 MED ORDER — ERYTHROMYCIN 5 MG/GM OP OINT: 1.0000 "application " | TOPICAL_OINTMENT | Freq: Three times a day (TID) | OPHTHALMIC | 0 refills | Status: DC

## 2019-12-31 MED ORDER — EYE WASH OPHTH SOLN
OPHTHALMIC | Status: AC
  Filled 2019-12-31: qty 118

## 2019-12-31 MED ORDER — ERYTHROMYCIN 5 MG/GM OP OINT
TOPICAL_OINTMENT | Freq: Once | OPHTHALMIC | Status: AC
  Administered 2019-12-31: 1 via OPHTHALMIC

## 2019-12-31 MED ORDER — IBUPROFEN 800 MG PO TABS
ORAL_TABLET | ORAL | Status: AC
  Filled 2019-12-31: qty 1

## 2019-12-31 MED ORDER — ERYTHROMYCIN 5 MG/GM OP OINT
TOPICAL_OINTMENT | OPHTHALMIC | Status: AC
  Filled 2019-12-31: qty 3.5

## 2019-12-31 MED ORDER — TETRACAINE HCL 0.5 % OP SOLN
OPHTHALMIC | Status: AC
  Filled 2019-12-31: qty 4

## 2019-12-31 MED ORDER — IBUPROFEN 800 MG PO TABS: 800.0000 mg | ORAL_TABLET | Freq: Once | ORAL | Status: DC

## 2019-12-31 MED ORDER — FLUORESCEIN SODIUM 1 MG OP STRP
ORAL_STRIP | OPHTHALMIC | Status: AC
  Filled 2019-12-31: qty 1

## 2019-12-31 NOTE — ED Triage Notes (Signed)
Pt reports that while walking outside, he walked into a branch or reed and caused irritation to his left eye. Pt is able to fully open his left eye, sclera with erythema and mild tearing.  Denies blurred vision.  No OTC meds for pain PTA.

## 2019-12-31 NOTE — Discharge Instructions (Signed)
Use eye ointment 3-4 times a day.  Make sure to get a good dose and at bedtime Tylenol or ibuprofen for pain Return promptly if worse instead of better

## 2020-01-03 NOTE — ED Provider Notes (Signed)
[facility]    CSN: [id number] Arrival date & time: 12/31/19  1518      History   Chief Complaint Chief Complaint  Patient presents with  . Eye Injury    HPI Cristian Taylor is a 29 y.o. male.   HPI   Very pleasant 29 year old with autism brought in by father for  Left eye injury that occurred this afternoon.  He was outside and got eye caught on twig.  Vision is not impaired but eye is painful  Past Medical History:  Diagnosis Date  . Asthma    childhood/resolved  . Autistic disorder, current or active state   . Disorders of fatty acid oxidation    per Dr. Ananias Pilgrim  . Food allergy    and sensitivities  . Mineral deficiency, not elsewhere classified    per Dr. Ananias Pilgrim  . Unspecified vitamin D deficiency    per Dr. Ananias Pilgrim    Patient Active Problem List   Diagnosis Date Noted  . Autism spectrum disorder 07/10/2012    History reviewed. No pertinent surgical history.     Home Medications    Prior to Admission medications   Medication Sig Start Date End Date Taking? Authorizing Provider  Acetylcarnitine HCl 250 MG CAPS Take 1 capsule by mouth daily.    [provider]  Acetylcysteine (N-ACETYL-L-CYSTEINE PO) Take 500 mg by mouth daily.    [provider]  Cholecalciferol (VITAMIN D) 1000 UNITS capsule Take 1,000 Units by mouth daily.    [provider]  Coenzyme Q10 (COQ10) 50 MG CAPS Take 2 capsules by mouth daily.    [provider]  Digestive Enzymes (ENZYME DIGEST PO) Take 3-4 capsules by mouth daily. Enzyme Complete    [provider]  erythromycin ophthalmic ointment Place 1 application into the left eye 3 (three) times daily. Place a 1/4 inch ribbon of ointment into the lower eyelid. 12/31/19   Eustace Moore, MD  FOLIC ACID PO Take by mouth. As needed for a portion of the year.    [provider]  GAMMA AMINOBUTYRIC ACID PO Take 150 mg by mouth daily. Reported on 01/05/2015     [provider]  Glucosamine Sulfate 500 MG CAPS Take 1 capsule by mouth daily. Reported on 01/05/2015    [provider]  GLUTATHIONE PO Take 250 mg by mouth daily. Reported on 01/05/2015    [provider]  GLYCINE PO Take by mouth. Reported on 01/05/2015    [provider]  MAGNESIUM GLYCINATE PLUS PO Take 200 mg by mouth daily. Reported on 01/05/2015    [provider]  Magnesium Oxide (MAG-OXIDE PO) Take 180 mg by mouth daily.    [provider]  Melatonin 1 MG CAPS Take 1 capsule by mouth as needed (for sleep).    [provider]  Multiple Vitamins-Minerals (ZINC PO) Take 20 mg by mouth daily. Reported on 01/05/2015    [provider]  Omega-3 Fatty Acids (FISH OIL) 1000 MG CAPS Take 4 capsules by mouth daily.    [provider]  POTASSIUM CHLORIDE PO Take by mouth. As needed for a portion of the year.    [provider]  Probiotic Product (SOLUBLE FIBER/PROBIOTICS PO) Take 1 tablet by mouth daily.    [provider]  SELENIUM PO Take by mouth. As needed for a portion of the year.    [provider]  TYROSINE PO Take by mouth. Reported on 01/05/2015  [provider]  vitamin A 94854 UNIT capsule Take 10,000 Units by mouth daily.    [provider]  vitamin C (ASCORBIC ACID) 250 MG tablet Take 250 mg by mouth daily.     [provider]  vitamin E 400 UNIT capsule Take 400 Units by mouth daily.    [provider]    Family History Family History  Problem Relation Age of Onset  . Cancer Father        thyroid cancer  . Diabetes Father   . Hyperlipidemia Mother   . Hypothyroidism Maternal Grandmother   . Cancer Maternal Grandmother        skin  . Heart disease Maternal Grandfather   . Diabetes Maternal Grandfather   . Asthma Paternal Grandmother   . Diabetes Paternal Grandfather     Social History Social History   Tobacco Use  .  Smoking status: Never Smoker  . Smokeless tobacco: Never Used  Vaping Use  . Vaping Use: Never used  Substance Use Topics  . Alcohol use: No  . Drug use: No     Allergies   Casein, Gluten meal, and Mold extract [trichophyton]   Review of Systems Review of Systems See HPI Physical Exam Triage Vital Signs ED Triage Vitals  Enc Vitals Group     BP 12/31/19 1607 105/64     Pulse Rate 12/31/19 1607 81     Resp 12/31/19 1607 18     Temp 12/31/19 1607 98.2 F (36.8 C)     Temp Source 12/31/19 1607 Oral     SpO2 12/31/19 1607 96 %     Weight --      Height --      Head Circumference --      Peak Flow --      Pain Score 12/31/19 1633 1     Pain Loc --      Pain Edu? --      Excl. in GC? --    No data found.  Updated Vital Signs BP 105/64 (BP Location: Left Arm)   Pulse 81   Temp 98.2 F (36.8 C) (Oral)   Resp 18   SpO2 96%   Visual Acuity Right Eye Distance: 20/20 Left Eye Distance: 20/20 Bilateral Distance: 20/20  Right Eye Near:   Left Eye Near:    Bilateral Near:     Physical Exam Constitutional:      General: He is not in acute distress.    Appearance: He is well-developed and well-nourished.  HENT:     Head: Normocephalic and atraumatic.     Mouth/Throat:     Mouth: Oropharynx is clear and moist.  Eyes:     General: Lids are normal. Lids are everted, no foreign bodies appreciated. Vision grossly intact. Gaze aligned appropriately.        Left eye: Hordeolum present.No foreign body or discharge.     Conjunctiva/sclera: Conjunctivae normal.     Left eye: Left conjunctiva is not injected.     Pupils: Pupils are equal, round, and reactive to light.     Left eye: Corneal abrasion and fluorescein uptake present.   Cardiovascular:     Rate and Rhythm: Normal rate.  Pulmonary:     Effort: Pulmonary effort is normal. No respiratory distress.  Abdominal:     General: There is no distension.     Palpations: Abdomen is soft.  Musculoskeletal:         General: No edema. Normal range of  motion.     Cervical back: Normal range of motion.  Skin:    General: Skin is warm and dry.  Neurological:     Mental Status: He is alert.  Psychiatric:        Attention and Perception: Attention normal.        Mood and Affect: Mood normal.        Behavior: Behavior normal. Behavior is cooperative.      UC Treatments / Results  Labs (all labs ordered are listed, but only abnormal results are displayed) Labs Reviewed - No data to display  EKG   Radiology No results found.  Procedures Procedures (including critical care time)  Medications Ordered in UC Medications  erythromycin ophthalmic ointment (1 application Left Eye Given 12/31/19 1630)    Initial Impression / Assessment and Plan / UC Course  I have reviewed the triage vital signs and the nursing notes.  Pertinent labs & imaging results that were available during my care of the patient were reviewed by me and considered in my medical decision making (see chart for details).    Reviewed injury.  Father observed the defect.  Treatment discussed.  Reasons for return.  Pain management  Final Clinical Impressions(s) / UC Diagnoses   Final diagnoses:  Abrasion of left cornea, initial encounter     Discharge Instructions     Use eye ointment 3-4 times a day.  Make sure to get a good dose and at bedtime Tylenol or ibuprofen for pain Return promptly if worse instead of better   ED Prescriptions    Medication Sig Dispense Auth. Provider   erythromycin ophthalmic ointment Place 1 application into the left eye 3 (three) times daily. Place a 1/4 inch ribbon of ointment into the lower eyelid. 1 g Eustace Moore, MD     PDMP not reviewed this encounter.   Eustace Moore, MD 01/03/20 [phone]

## 2022-02-07 DIAGNOSIS — Z419 Encounter for procedure for purposes other than remedying health state, unspecified: Secondary | ICD-10-CM | POA: Diagnosis not present

## 2022-03-08 DIAGNOSIS — Z419 Encounter for procedure for purposes other than remedying health state, unspecified: Secondary | ICD-10-CM | POA: Diagnosis not present

## 2022-04-08 DIAGNOSIS — Z419 Encounter for procedure for purposes other than remedying health state, unspecified: Secondary | ICD-10-CM | POA: Diagnosis not present

## 2022-04-17 ENCOUNTER — Encounter (HOSPITAL_COMMUNITY): Payer: Self-pay

## 2022-04-17 ENCOUNTER — Ambulatory Visit (HOSPITAL_COMMUNITY)
Admission: EM | Admit: 2022-04-17 | Discharge: 2022-04-17 | Disposition: A | Discharge status: Home / Self Care | Payer: Medicaid Other | Attending: Family Medicine | Admitting: Family Medicine

## 2022-04-17 DIAGNOSIS — H5789 Other specified disorders of eye and adnexa: Secondary | ICD-10-CM

## 2022-04-17 MED ORDER — ERYTHROMYCIN 5 MG/GM OP OINT: 1.0000 | TOPICAL_OINTMENT | Freq: Three times a day (TID) | OPHTHALMIC | 0 refills | Status: AC

## 2022-04-17 MED ORDER — EYE WASH OP SOLN
OPHTHALMIC | Status: AC
Start: 2022-04-17 — End: ?
  Filled 2022-04-17: qty 118

## 2022-04-17 MED ORDER — TETRACAINE HCL 0.5 % OP SOLN
OPHTHALMIC | Status: AC
  Filled 2022-04-17: qty 4

## 2022-04-17 MED ORDER — FLUORESCEIN SODIUM 1 MG OP STRP
ORAL_STRIP | OPHTHALMIC | Status: AC
  Filled 2022-04-17: qty 1

## 2022-04-17 NOTE — ED Provider Notes (Signed)
[facility]    CSN: [id number] Arrival date & time: 04/17/22  1936      History   Chief Complaint Chief Complaint  Patient presents with   Eye Problem    HPI Cristian Taylor is a 32 y.o. male.    Eye Problem  Here with left eye irritation and pain.  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    Past Medical History:  Diagnosis Date   Asthma    childhood/resolved   Autistic disorder, current or active state    Disorders of fatty acid oxidation    per Dr. Ananias PilgrimVaughan   Food allergy    and sensitivities   Mineral deficiency, not elsewhere classified    per Dr. Ananias PilgrimVaughan   Unspecified vitamin D deficiency    per Dr. Ananias PilgrimVaughan    Patient Active Problem List   Diagnosis Date Noted   Autism spectrum disorder 07/10/2012    History reviewed. No pertinent surgical history.     Home Medications    Prior to Admission medications   Medication Sig Start Date End Date Taking? Authorizing Provider  Acetylcarnitine HCl 250 MG CAPS Take 1 capsule by mouth daily.    [provider]  Acetylcysteine (N-ACETYL-L-CYSTEINE PO) Take 500 mg by mouth daily.    [provider]  Cholecalciferol (VITAMIN D) 1000 UNITS capsule Take 1,000 Units by mouth daily.    [provider]  Coenzyme Q10 (COQ10) 50 MG CAPS Take 2 capsules by mouth daily.    [provider]  Digestive Enzymes (ENZYME DIGEST PO) Take 3-4 capsules by mouth daily. Enzyme Complete    [provider]  erythromycin ophthalmic ointment Place 1 Application into the left eye 3 (three) times daily for 5 days. Place a 1/4 inch ribbon of ointment into the lower eyelid. 04/17/22 04/22/22  Zenia ResidesBanister, Ryler Laskowski K, MD  FOLIC ACID PO Take by mouth. As needed for a portion of the year.    [provider]  GAMMA AMINOBUTYRIC ACID PO Take 150 mg by mouth daily. Reported on 01/05/2015    [provider]   Glucosamine Sulfate 500 MG CAPS Take 1 capsule by mouth daily. Reported on 01/05/2015    [provider]  GLUTATHIONE PO Take 250 mg by mouth daily. Reported on 01/05/2015    [provider]  GLYCINE PO Take by mouth. Reported on 01/05/2015    [provider]  MAGNESIUM GLYCINATE PLUS PO Take 200 mg by mouth daily. Reported on 01/05/2015    [provider]  Magnesium Oxide (MAG-OXIDE PO) Take 180 mg by mouth daily.    [provider]  Melatonin 1 MG CAPS Take 1 capsule by mouth as needed (for sleep).    [provider]  Multiple Vitamins-Minerals (ZINC PO) Take 20 mg by mouth daily. Reported on 01/05/2015    [provider]  Omega-3 Fatty Acids (FISH OIL) 1000 MG CAPS Take 4 capsules by mouth daily.    [provider]  POTASSIUM CHLORIDE PO Take by mouth. As needed for a portion of the year.    [provider]  Probiotic Product (SOLUBLE FIBER/PROBIOTICS PO) Take 1 tablet by mouth daily.    [provider]  SELENIUM PO Take by mouth. As needed for a portion of the year.    [provider]  TYROSINE PO Take by mouth. Reported on 01/05/2015    [provider]  vitamin A 02111 UNIT capsule Take 10,000 Units by mouth daily.    [provider]  vitamin C (ASCORBIC ACID) 250 MG tablet Take 250 mg by mouth daily.     [provider]  vitamin E 400 UNIT capsule Take 400 Units by mouth daily.    [provider]    Family History Family History  Problem Relation Age of Onset   Cancer Father        thyroid cancer   Diabetes Father    Hyperlipidemia Mother    Hypothyroidism Maternal Grandmother    Cancer Maternal Grandmother        skin   Heart disease Maternal Grandfather    Diabetes Maternal Grandfather    Asthma Paternal Grandmother    Diabetes Paternal Grandfather     Social History Social History   Tobacco Use   Smoking status: Never   Smokeless  tobacco: Never  Vaping Use   Vaping Use: Never used  Substance Use Topics   Alcohol use: No   Drug use: No     Allergies   Casein, Gluten meal, and Mold extract [trichophyton]   Review of Systems Review of Systems   Physical Exam Triage Vital Signs ED Triage Vitals  Enc Vitals Group     BP 04/17/22 2017 129/75     Pulse Rate 04/17/22 2017 75     Resp 04/17/22 2017 18     Temp 04/17/22 2017 98.1 F (36.7 C)     Temp src --      SpO2 04/17/22 2017 96 %     Weight --      Height --      Head Circumference --      Peak Flow --      Pain Score 04/17/22 2018 6     Pain Loc --      Pain Edu? --      Excl. in GC? --    No data found.  Updated Vital Signs BP 129/75   Pulse 75   Temp 98.1 F (36.7 C)   Resp 18   SpO2 96%   Visual Acuity Right Eye Distance: 20/20 Left Eye Distance: 20/30 Bilateral Distance: 20/20  Right Eye Near:   Left Eye Near:    Bilateral Near:     Physical Exam Vitals reviewed.  Constitutional:      General: He is not in acute distress.    Appearance: He is not ill-appearing, toxic-appearing or diaphoretic.  HENT:     Mouth/Throat:     Mouth: Mucous membranes are moist.  Eyes:     Extraocular Movements: Extraocular movements intact.     Conjunctiva/sclera: Conjunctivae normal.     Pupils: Pupils are equal, round, and reactive to light.  Cardiovascular:     Rate and Rhythm: Normal rate and regular rhythm.  Pulmonary:     Effort: Pulmonary effort is normal.     Breath sounds: Normal breath sounds.  Musculoskeletal:     Cervical back: Neck supple.  Lymphadenopathy:     Cervical: No cervical adenopathy.  Skin:    Capillary Refill: Capillary refill takes less than 2 seconds.     Coloration: Skin is not pale.  Neurological:     General: No focal deficit present.     Mental Status: He is alert and oriented to person, place, and time.  Psychiatric:        Behavior: Behavior normal.      UC Treatments / Results  Labs (  all  labs ordered are listed, but only abnormal results are displayed) Labs Reviewed - No data to display  EKG   Radiology No results found.  Procedures Procedures (including critical care time)  Medications Ordered in UC Medications - No data to display  Initial Impression / Assessment and Plan / UC Course  I have reviewed the triage vital signs and the nursing notes.  Pertinent labs & imaging results that were available during my care of the patient were reviewed by me and considered in my medical decision making (see chart for details).          Tetracaine drops were applied to the left eye.  Fluorescein staining is done.  With ultraviolet light, there is no uptake seen.  I have sent in the erythromycin ophthalmic ointment to use 3 times daily.  They can also put in saline drops for soothing purposes.  Final Clinical Impressions(s) / UC Diagnoses   Final diagnoses:  Eye irritation     Discharge Instructions      We did not see a corneal abrasion tonight  You can put erythromycin ointment in your eye 3 times daily for 5 days.  You can also use over-the-counter saline eyedrops to soothe your eyes.      ED Prescriptions     Medication Sig Dispense Auth. Provider   erythromycin ophthalmic ointment Place 1 Application into the left eye 3 (three) times daily for 5 days. Place a 1/4 inch ribbon of ointment into the lower eyelid. 1 g Zenia Resides, MD      PDMP not reviewed this encounter.   Zenia Resides, MD 04/17/22 2035

## 2022-04-17 NOTE — Discharge Instructions (Signed)
We did not see a corneal abrasion tonight  You can put erythromycin ointment in your eye 3 times daily for 5 days.  You can also use over-the-counter saline eyedrops to soothe your eyes.

## 2022-04-17 NOTE — ED Triage Notes (Signed)
Pt presents with complaints of eye pain after debris got in eye today at work. Left eye.

## 2022-05-02 ENCOUNTER — Encounter: Payer: Self-pay | Admitting: Family Medicine

## 2022-05-02 ENCOUNTER — Ambulatory Visit (INDEPENDENT_AMBULATORY_CARE_PROVIDER_SITE_OTHER): Payer: Medicaid Other | Admitting: Family Medicine

## 2022-05-02 VITALS — BP 104/68 | HR 62 | Temp 97.8°F | Ht 67.0 in | Wt 177.0 lb

## 2022-05-02 DIAGNOSIS — F840 Autistic disorder: Secondary | ICD-10-CM

## 2022-05-02 DIAGNOSIS — Z87828 Personal history of other (healed) physical injury and trauma: Secondary | ICD-10-CM

## 2022-05-02 DIAGNOSIS — Z7689 Persons encountering health services in other specified circumstances: Secondary | ICD-10-CM | POA: Diagnosis not present

## 2022-05-02 NOTE — Progress Notes (Signed)
New Patient Office Visit  Subjective    Patient ID: Cristian Taylor, male    DOB: 01/31/1990  Age: 32 y.o. MRN: [id number]  CC:  Chief Complaint  Patient presents with   Establish Care    No concerns    HPI Cristian Taylor presents to establish care  Requests referral for eye doctor to establish care. States he has had multiple eye injuries. No issues now. No eye drainage, pain or vision changes.  States he would like to go to [facility].   His mother drove him here today.  Autism Spectrum Disorder.   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   Outpatient Encounter Medications as of 05/02/2022  Medication Sig   Acetylcarnitine HCl 250 MG CAPS Take 1 capsule by mouth daily.   Cholecalciferol (VITAMIN D) 1000 UNITS capsule Take 1,000 Units by mouth daily.   Coenzyme Q10 (COQ10) 50 MG CAPS Take 2 capsules by mouth daily.   Digestive Enzymes (ENZYME DIGEST PO) Take 3-4 capsules by mouth daily. Enzyme Complete   FOLIC ACID PO Take by mouth. As needed for a portion of the year.   GAMMA AMINOBUTYRIC ACID PO Take 150 mg by mouth daily. Reported on 01/05/2015   Glucosamine Sulfate 500 MG CAPS Take 1 capsule by mouth daily. Reported on 01/05/2015   GLUTATHIONE PO Take 250 mg by mouth daily. Reported on 01/05/2015   GLYCINE PO Take by mouth. Reported on 01/05/2015   MAGNESIUM GLYCINATE PLUS PO Take 200 mg by mouth daily. Reported on 01/05/2015   Magnesium Oxide (MAG-OXIDE PO) Take 180 mg by mouth daily.   Melatonin 1 MG CAPS Take 1 capsule by mouth as needed (for sleep).   Multiple Vitamins-Minerals (ZINC PO) Take 20 mg by mouth daily. Reported on 01/05/2015   Omega-3 Fatty Acids (FISH OIL) 1000 MG CAPS Take 4 capsules by mouth daily.   POTASSIUM CHLORIDE PO Take by mouth. As needed for a portion of the year.   Probiotic Product (SOLUBLE FIBER/PROBIOTICS PO) Take 1 tablet by mouth daily.   SELENIUM PO Take by mouth. As needed for a  portion of the year.   TYROSINE PO Take by mouth. Reported on 01/05/2015   vitamin A 40981 UNIT capsule Take 10,000 Units by mouth daily.   vitamin C (ASCORBIC ACID) 250 MG tablet Take 250 mg by mouth daily.    vitamin E 400 UNIT capsule Take 400 Units by mouth daily.   [DISCONTINUED] Acetylcysteine (N-ACETYL-L-CYSTEINE PO) Take 500 mg by mouth daily.   No facility-administered encounter medications on file as of 05/02/2022.    Past Medical History:  Diagnosis Date   Allergy 1997   Asthma    childhood/resolved   Autistic disorder, current or active state    Disorders of fatty acid oxidation    per Dr. Ananias Pilgrim   Food allergy    and sensitivities   Mineral deficiency, not elsewhere classified    per Dr. Ananias Pilgrim   Unspecified vitamin D deficiency    per Dr. Ananias Pilgrim    History reviewed. No pertinent surgical history.  Family History  Problem Relation Age of Onset   Cancer Father        thyroid cancer   Diabetes Father    Hyperlipidemia Mother    Hypothyroidism Maternal Grandmother    Cancer Maternal Grandmother        skin   Heart disease Maternal Grandfather    Diabetes Maternal Grandfather  Asthma Paternal Grandmother    Diabetes Paternal Grandfather     Social History   Socioeconomic History   Marital status: Single    Spouse name: Not on file   Number of children: Not on file   Years of education: Not on file   Highest education level: Not on file  Occupational History   Not on file  Tobacco Use   Smoking status: Never   Smokeless tobacco: Never  Vaping Use   Vaping Use: Never used  Substance and Sexual Activity   Alcohol use: No   Drug use: No   Sexual activity: Never  Other Topics Concern   Not on file  Social History Narrative   Lives with parents, brother and sister in college), 1 dog, 1 cat.  He was homeschooled since 8th grade. He is taking biology at [employer].  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    Appearance: He is not ill-appearing.  Eyes:     Extraocular Movements: Extraocular movements intact.     Conjunctiva/sclera: Conjunctivae normal.     Pupils: Pupils are equal, round, and reactive to light.  Cardiovascular:     Rate and Rhythm: Normal rate and regular rhythm.  Pulmonary:     Effort: Pulmonary effort is normal.     Breath sounds: Normal breath sounds.  Musculoskeletal:     Cervical back: Normal range of motion and neck supple.  Skin:    General: Skin is warm and dry.  Neurological:     General: No focal deficit present.     Mental Status: He is alert and oriented to person, place, and time.  Psychiatric:        Mood and Affect: Mood normal.        Behavior: Behavior normal.        Thought Content: Thought content normal.         Assessment & Plan:   Problem List Items Addressed This Visit       Other   Autism spectrum disorder   Other Visit Diagnoses     History of eye injury    -  Primary   Relevant Orders   Ambulatory referral to Ophthalmology   Encounter to establish care          He is a pleasant 32 year old male who is new to the practice and here to establish care.  His only request today is a referral to an ophthalmologist for regular eye exams and eye care.  He does have a history of eye injury including a corneal abrasion earlier this year.  Denies any symptoms  today.  Follow-up as needed  Return if symptoms worsen or fail to improve.   Hetty Blend, NP-C

## 2022-05-02 NOTE — Patient Instructions (Signed)
Thank you for trusting [place] with your healthcare.   I placed a referral to [facility]. If you have not heard from them in the next 1-2 weeks, you can call them to schedule a visit.

## 2022-05-08 DIAGNOSIS — Z419 Encounter for procedure for purposes other than remedying health state, unspecified: Secondary | ICD-10-CM | POA: Diagnosis not present

## 2023-04-18 ENCOUNTER — Ambulatory Visit: Admitting: Family Medicine

## 2023-04-18 ENCOUNTER — Encounter: Payer: Self-pay | Admitting: Family Medicine

## 2023-04-18 VITALS — BP 120/76 | HR 64 | Temp 97.8°F | Ht 67.0 in | Wt 194.0 lb

## 2023-04-18 DIAGNOSIS — Z6830 Body mass index (BMI) 30.0-30.9, adult: Secondary | ICD-10-CM

## 2023-04-18 DIAGNOSIS — E66811 Obesity, class 1: Secondary | ICD-10-CM

## 2023-04-18 DIAGNOSIS — Z0001 Encounter for general adult medical examination with abnormal findings: Secondary | ICD-10-CM

## 2023-04-18 DIAGNOSIS — Z0000 Encounter for general adult medical examination without abnormal findings: Secondary | ICD-10-CM

## 2023-04-18 LAB — COMPREHENSIVE METABOLIC PANEL WITH GFR
ALT: 36 U/L (ref 0–53)
AST: 27 U/L (ref 0–37)
Albumin: 4.7 g/dL (ref 3.5–5.2)
Alkaline Phosphatase: 62 U/L (ref 39–117)
BUN: 14 mg/dL (ref 6–23)
CO2: 30 meq/L (ref 19–32)
Calcium: 9.2 mg/dL (ref 8.4–10.5)
Chloride: 103 meq/L (ref 96–112)
Creatinine, Ser: 0.85 mg/dL (ref 0.40–1.50)
GFR: 116.47 mL/min (ref >60.00)
Glucose, Bld: 87 mg/dL (ref 70–99)
Potassium: 4.3 meq/L (ref 3.5–5.1)
Sodium: 139 meq/L (ref 135–145)
Total Bilirubin: 0.5 mg/dL (ref 0.2–1.2)
Total Protein: 7 g/dL (ref 6.0–8.3)

## 2023-04-18 LAB — CBC
HCT: 46.7 % (ref 39.0–52.0)
Hemoglobin: 16 g/dL (ref 13.0–17.0)
MCHC: 34.2 g/dL (ref 30.0–36.0)
MCV: 85 fl (ref 78.0–100.0)
Platelets: 258 10*3/uL (ref 150.0–400.0)
RBC: 5.5 Mil/uL (ref 4.22–5.81)
RDW: 12.6 % (ref 11.5–15.5)
WBC: 7.1 10*3/uL (ref 4.0–10.5)

## 2023-04-18 LAB — TSH: TSH: 1.52 u[IU]/mL (ref 0.35–5.50)

## 2023-04-18 NOTE — Progress Notes (Signed)
 Please let him know that his labs are normal.

## 2023-04-18 NOTE — Patient Instructions (Signed)
 Please let go downstairs for blood work before you leave.  Your tetanus vaccine is up-to-date until 2026.  I recommend that you schedule a dental exam.  We will be in touch with your results from today.

## 2023-04-18 NOTE — Progress Notes (Signed)
 Subjective:     Patient ID: Cristian Taylor, male    DOB: [birth year], 33 y.o.   MRN: [id number]  Chief Complaint  Patient presents with   Annual Exam    Check up     HPI  History of Present Illness          He is here for a wellness check.  Denies any concerns.  He has an eye doctor.  He is overdue for dental exam.  He would like to know if these vaccines are up-to-date.  He takes several over-the-counter supplements, no prescription medications.  He drove here today.  He works at the [employer].    Health Maintenance Due  Topic Date Due   HIV Screening  Never done   Hepatitis C Screening  Never done   COVID-19 Vaccine (2 - 2024-25 season) 09/08/2022    Past Medical History:  Diagnosis Date   Allergy 1997   Asthma    childhood/resolved   Autistic disorder, current or active state    Disorders of fatty acid oxidation    per Dr. Ananias Pilgrim   Food allergy    and sensitivities   Mineral deficiency, not elsewhere classified    per Dr. Ananias Pilgrim   Unspecified vitamin D deficiency    per Dr. Ananias Pilgrim    History reviewed. No pertinent surgical history.  Family History  Problem Relation Age of Onset   Cancer Father        thyroid cancer   Diabetes Father    Hyperlipidemia Mother    Hypothyroidism Maternal Grandmother    Cancer Maternal Grandmother        skin   Heart disease Maternal Grandfather    Diabetes Maternal Grandfather    Asthma Paternal Grandmother    Diabetes Paternal Grandfather     Social History   Socioeconomic History   Marital status: Single    Spouse name: Not on file   Number of children: Not on file   Years of education: Not on file   Highest education level: Bachelor's degree (e.g., BA, AB, BS)  Occupational History   Not on file  Tobacco Use   Smoking status: Never   Smokeless tobacco: Never  Vaping Use   Vaping status: Never Used  Substance and Sexual Activity   Alcohol use: No   Drug use: No   Sexual activity:  Never  Other Topics Concern   Not on file  Social History Narrative   Lives with parents, brother and sister in college), 1 dog, 1 cat.  He was homeschooled since 8th grade. He is taking biology at [employer].  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)   Social Drivers of Corporate investment banker Strain: Low Risk  (04/18/2023)   Overall Financial Resource Strain (CARDIA)    Difficulty of Paying Living Expenses: Not very hard  Food Insecurity: No Food Insecurity (04/18/2023)   Hunger Vital Sign    Worried About Running Out of Food in the Last Year: Never true    Ran Out of Food in the Last Year: Never true  Transportation Needs: No Transportation Needs (04/18/2023)   PRAPARE - Administrator, Civil Service (Medical): No    Lack of Transportation (Non-Medical): No  Physical Activity: Sufficiently Active (04/18/2023)   Exercise Vital Sign    Days of Exercise per Week: 5 days    Minutes of Exercise per Session: 70 min  Stress: No Stress Concern Present (  04/18/2023)   [place] [employer] - Occupational Stress Questionnaire    Feeling of Stress : Not at all  Social Connections: Unknown (04/18/2023)   Social Connection and Isolation Panel [NHANES]    Frequency of Communication with Friends and Family: Patient declined    Frequency of Social Gatherings with Friends and Family: More than three times a week    Attends Religious Services: Patient declined    Database administrator or Organizations: No    Attends Engineer, structural: Not on file    Marital Status: Never married  Intimate Partner Violence: Not on file    Outpatient Medications Prior to Visit  Medication Sig Dispense Refill   Acetylcarnitine HCl 250 MG CAPS Take 1 capsule by mouth daily.     Cholecalciferol (VITAMIN D) 1000 UNITS capsule Take 1,000 Units by mouth daily.     Coenzyme Q10 (COQ10) 50 MG CAPS Take 2 capsules by mouth daily.     Digestive  Enzymes (ENZYME DIGEST PO) Take 3-4 capsules by mouth daily. Enzyme Complete     FOLIC ACID PO Take by mouth. As needed for a portion of the year.     GAMMA AMINOBUTYRIC ACID PO Take 150 mg by mouth daily. Reported on 01/05/2015     Glucosamine Sulfate 500 MG CAPS Take 1 capsule by mouth daily. Reported on 01/05/2015     GLUTATHIONE PO Take 250 mg by mouth daily. Reported on 01/05/2015     GLYCINE PO Take by mouth. Reported on 01/05/2015     MAGNESIUM GLYCINATE PLUS PO Take 200 mg by mouth daily. Reported on 01/05/2015     Magnesium Oxide (MAG-OXIDE PO) Take 180 mg by mouth daily.     Melatonin 1 MG CAPS Take 1 capsule by mouth as needed (for sleep).     Multiple Vitamins-Minerals (ZINC PO) Take 20 mg by mouth daily. Reported on 01/05/2015     Omega-3 Fatty Acids (FISH OIL) 1000 MG CAPS Take 4 capsules by mouth daily.     POTASSIUM CHLORIDE PO Take by mouth. As needed for a portion of the year.     Probiotic Product (SOLUBLE FIBER/PROBIOTICS PO) Take 1 tablet by mouth daily.     SELENIUM PO Take by mouth. As needed for a portion of the year.     TYROSINE PO Take by mouth. Reported on 01/05/2015     vitamin A 16109 UNIT capsule Take 10,000 Units by mouth daily.     vitamin C (ASCORBIC ACID) 250 MG tablet Take 250 mg by mouth daily.      vitamin E 400 UNIT capsule Take 400 Units by mouth daily.     No facility-administered medications prior to visit.    Allergies  Allergen Reactions   Casein    Gluten Meal    Mold Extract [Trichophyton]     Review of Systems  Constitutional:  Negative for chills, fever, malaise/fatigue and weight loss.  HENT:  Negative for congestion, ear pain, sinus pain and sore throat.   Eyes:  Negative for blurred vision, double vision and pain.  Respiratory:  Negative for cough, shortness of breath and wheezing.   Cardiovascular:  Negative for chest pain, palpitations and leg swelling.  Gastrointestinal:  Negative for abdominal pain, constipation, diarrhea,  nausea and vomiting.  Genitourinary:  Negative for dysuria, frequency and urgency.  Musculoskeletal:  Negative for back pain, joint pain and myalgias.  Skin:  Negative for rash.  Neurological:  Negative for dizziness, tingling, focal  weakness and headaches.  Psychiatric/Behavioral:  Negative for depression. The patient is not nervous/anxious.        Objective:    Physical Exam Constitutional:      General: He is not in acute distress.    Appearance: He is not ill-appearing.  HENT:     Right Ear: Tympanic membrane, ear canal and external ear normal.     Left Ear: Tympanic membrane, ear canal and external ear normal.     Nose: Nose normal.     Mouth/Throat:     Mouth: Mucous membranes are moist.     Pharynx: Oropharynx is clear.  Eyes:     Extraocular Movements: Extraocular movements intact.     Conjunctiva/sclera: Conjunctivae normal.     Pupils: Pupils are equal, round, and reactive to light.  Neck:     Thyroid: No thyroid mass, thyromegaly or thyroid tenderness.  Cardiovascular:     Rate and Rhythm: Normal rate and regular rhythm.     Pulses: Normal pulses.     Heart sounds: Normal heart sounds.  Pulmonary:     Effort: Pulmonary effort is normal.     Breath sounds: Normal breath sounds.  Abdominal:     General: Bowel sounds are normal. There is no distension.     Palpations: Abdomen is soft.     Tenderness: There is no abdominal tenderness. There is no right CVA tenderness, left CVA tenderness, guarding or rebound.  Musculoskeletal:        General: Normal range of motion.     Cervical back: Normal range of motion and neck supple. No tenderness.     Right lower leg: No edema.     Left lower leg: No edema.  Lymphadenopathy:     Cervical: No cervical adenopathy.  Skin:    General: Skin is warm and dry.     Findings: No lesion or rash.  Neurological:     General: No focal deficit present.     Mental Status: He is alert and oriented to person, place, and time.      Cranial Nerves: No cranial nerve deficit.     Sensory: No sensory deficit.     Motor: No weakness.  Psychiatric:        Mood and Affect: Mood normal.        Behavior: Behavior normal.        Thought Content: Thought content normal.      BP 120/76 (BP Location: Left Arm, Patient Position: Sitting, Cuff Size: Normal)   Pulse 64   Temp 97.8 F (36.6 C) (Temporal)   Ht 5\' 7"  (1.702 m)   Wt 194 lb (88 kg)   SpO2 97%   BMI 30.38 kg/m  Wt Readings from Last 3 Encounters:  04/18/23 194 lb (88 kg)  05/02/22 177 lb (80.3 kg)  02/19/18 178 lb 12.8 oz (81.1 kg)       Assessment & Plan:   Problem List Items Addressed This Visit   None Visit Diagnoses       Encounter for general adult medical examination with abnormal findings    -  Primary     Obesity (BMI 30.0-34.9)       Relevant Orders   CBC   Comprehensive metabolic panel with GFR   TSH      Preventive health care reviewed.  Counseling on healthy lifestyle including diet and exercise.  Recommend regular dental and eye exams.  Immunizations reviewed.  Discussed safety. Follow-up pending lab results.  I am having Cristian Taylor "Will" maintain his MAGNESIUM GLYCINATE PLUS PO, Magnesium Oxide (MAG-OXIDE PO), Glucosamine Sulfate, Digestive Enzymes (ENZYME DIGEST PO), CoQ10, GAMMA AMINOBUTYRIC ACID PO, Vitamin D, vitamin C, vitamin A, vitamin E, GLUTATHIONE PO, Melatonin, Probiotic Product (SOLUBLE FIBER/PROBIOTICS PO), SELENIUM PO, POTASSIUM CHLORIDE PO, FOLIC ACID PO, GLYCINE PO, TYROSINE PO, Multiple Vitamins-Minerals (ZINC PO), Fish Oil, and Acetylcarnitine HCl.  No orders of the defined types were placed in this encounter.
